# Patient Record
Sex: Female | Born: 1995 | Race: White | Hispanic: No | Marital: Single | Smoking: Former smoker
Health system: Southern US, Community
[De-identification: ages and names within clinical notes are randomized; demographics above are authoritative.]

## PROBLEM LIST (undated history)

## (undated) ENCOUNTER — Inpatient Hospital Stay (HOSPITAL_COMMUNITY): Payer: Self-pay

## (undated) ENCOUNTER — Inpatient Hospital Stay: Payer: Self-pay

## (undated) ENCOUNTER — Inpatient Hospital Stay (HOSPITAL_COMMUNITY): Payer: No Typology Code available for payment source | Admitting: Obstetrics and Gynecology

## (undated) DIAGNOSIS — E0590 Thyrotoxicosis, unspecified without thyrotoxic crisis or storm: Secondary | ICD-10-CM

## (undated) DIAGNOSIS — M797 Fibromyalgia: Secondary | ICD-10-CM

## (undated) DIAGNOSIS — J45909 Unspecified asthma, uncomplicated: Secondary | ICD-10-CM

## (undated) DIAGNOSIS — R5382 Chronic fatigue, unspecified: Secondary | ICD-10-CM

## (undated) HISTORY — PX: WISDOM TOOTH EXTRACTION: SHX21

## (undated) HISTORY — PX: UPPER GASTROINTESTINAL ENDOSCOPY: SHX188

## (undated) HISTORY — PX: NO PAST SURGERIES: SHX2092

---
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
## 2013-02-01 ENCOUNTER — Emergency Department (HOSPITAL_COMMUNITY)
Admission: EM | Admit: 2013-02-01 | Discharge: 2013-02-01 | Disposition: A | Discharge status: Home / Self Care | Payer: Medicaid Other | Attending: Emergency Medicine | Admitting: Emergency Medicine

## 2013-02-01 ENCOUNTER — Encounter (HOSPITAL_COMMUNITY): Payer: Self-pay | Admitting: *Deleted

## 2013-02-01 DIAGNOSIS — B0223 Postherpetic polyneuropathy: Secondary | ICD-10-CM | POA: Insufficient documentation

## 2013-02-01 DIAGNOSIS — M549 Dorsalgia, unspecified: Secondary | ICD-10-CM | POA: Insufficient documentation

## 2013-02-01 DIAGNOSIS — Z8739 Personal history of other diseases of the musculoskeletal system and connective tissue: Secondary | ICD-10-CM | POA: Insufficient documentation

## 2013-02-01 DIAGNOSIS — Z3202 Encounter for pregnancy test, result negative: Secondary | ICD-10-CM | POA: Insufficient documentation

## 2013-02-01 MED ORDER — ACYCLOVIR 200 MG PO CAPS
800.0000 mg | ORAL_CAPSULE | Freq: Once | ORAL | Status: AC
  Administered 2013-02-01: 800 mg via ORAL
  Filled 2013-02-01: qty 4

## 2013-02-01 MED ORDER — ACYCLOVIR 200 MG PO CAPS
400.0000 mg | ORAL_CAPSULE | Freq: Once | ORAL | Status: DC
  Filled 2013-02-01: qty 2

## 2013-02-01 MED ORDER — ACYCLOVIR 200 MG PO CAPS: 800.0000 mg | ORAL_CAPSULE | Freq: Every day | ORAL | Status: DC

## 2013-02-01 NOTE — ED Notes (Signed)
PT has raised non fluid filled areas on back lt upper side. Pt reports itching Sx's

## 2013-02-01 NOTE — ED Provider Notes (Signed)
History     CSN: [id number]  Arrival date & time 02/01/13  2026   First MD Initiated Contact with Patient 02/01/13 2106      Chief Complaint  Patient presents with  . Rash    (Consider location/radiation/quality/duration/timing/severity/associated sxs/prior treatment) HPI Comments: Patient has noticed a rash on her left mid back, below the this has been persistent and worsening.  She at first thought it was an allergic reaction, and has tried taking Benadryl, without resolution.  She reports, that it is gaining and slightly worse today.  Denies any fever, chills, or known ill contacts  Patient is a 17 y.o. female presenting with rash. The history is provided by the patient.  Rash Location:  Torso Quality: blistering, itchiness and painful   Quality: not draining and not weeping   Pain details:    Quality:  Stinging   Severity:  Mild   Onset quality:  Gradual   Duration:  3 days   Timing:  Constant   Progression:  Worsening Severity:  Moderate Onset quality:  Gradual Duration:  3 days Timing:  Constant Progression:  Worsening Chronicity:  New Relieved by:  Nothing Associated symptoms: no fever     Past Medical History  Diagnosis Date  . Fibromyalgia     History reviewed. No pertinent past surgical history.  No family history on file.  History  Substance Use Topics  . Smoking status: Never Smoker   . Smokeless tobacco: Not on file  . Alcohol Use: No    OB History   Grav Para Term Preterm Abortions TAB SAB Ect Mult Living                  Review of Systems  Constitutional: Negative for fever and chills.  HENT: Negative.   Eyes: Negative.   Respiratory: Negative.   Cardiovascular: Negative for chest pain.  Musculoskeletal: Positive for back pain.  Skin: Positive for rash.  Neurological: Negative for dizziness and weakness.  Psychiatric/Behavioral: Negative.   All other systems reviewed and are negative.    Allergies  Prednisone  Home  Medications   Current Outpatient Rx  Name  Route  Sig  Dispense  Refill  . acyclovir (ZOVIRAX) 200 MG capsule   Oral   Take 4 capsules (800 mg total) by mouth 5 (five) times daily.   28 capsule   0     BP 117/76  Pulse 84  Temp(Src) 98.3 F (36.8 C) (Oral)  SpO2 98%  LMP 01/18/2013  Physical Exam  Constitutional: She is oriented to person, place, and time. She appears well-developed and well-nourished.  HENT:  Head: Normocephalic.  Eyes: Pupils are equal, round, and reactive to light.  Neck: Normal range of motion.  Cardiovascular: Normal rate and regular rhythm.   Pulmonary/Chest: Effort normal and breath sounds normal. She exhibits no tenderness.  Musculoskeletal: Normal range of motion.  Neurological: She is alert and oriented to person, place, and time.  Skin: Skin is warm and dry. Rash noted.     Vesicular type rash route in a pattern, along a dermatome that extends from mid vertebral line under the scapula does not extend to the anterior chest wall..,  Although she does have some skin sensitivity, mid axillary line    ED Course  Procedures (including critical care time)  Labs Reviewed  POCT PREGNANCY, URINE   No results found.   1. Shingles (herpes zoster) polyneuropathy       MDM  Rash is consistent with shingles.  Will check pregnancy test as there is a slim chance she may be pregnant.  Despite the use of a NUVA ring then will start patient on acyclovir for 7 days with followup with her primary care physician         Arman Filter, NP 02/01/13 2228

## 2013-02-02 ENCOUNTER — Telehealth (HOSPITAL_COMMUNITY): Payer: Self-pay | Admitting: Emergency Medicine

## 2013-02-03 NOTE — ED Provider Notes (Signed)
Medical screening examination/treatment/procedure(s) were performed by non-physician practitioner and as supervising physician I was immediately available for consultation/collaboration.   Joya Gaskins, MD 02/03/13 1102

## 2013-03-27 ENCOUNTER — Emergency Department (HOSPITAL_COMMUNITY)
Admission: EM | Admit: 2013-03-27 | Discharge: 2013-03-28 | Disposition: A | Discharge status: Home / Self Care | Payer: Medicaid Other | Attending: Emergency Medicine | Admitting: Emergency Medicine

## 2013-03-27 ENCOUNTER — Encounter (HOSPITAL_COMMUNITY): Payer: Self-pay | Admitting: *Deleted

## 2013-03-27 DIAGNOSIS — F17200 Nicotine dependence, unspecified, uncomplicated: Secondary | ICD-10-CM | POA: Insufficient documentation

## 2013-03-27 DIAGNOSIS — R509 Fever, unspecified: Secondary | ICD-10-CM | POA: Insufficient documentation

## 2013-03-27 DIAGNOSIS — J029 Acute pharyngitis, unspecified: Secondary | ICD-10-CM | POA: Insufficient documentation

## 2013-03-27 DIAGNOSIS — Z8739 Personal history of other diseases of the musculoskeletal system and connective tissue: Secondary | ICD-10-CM | POA: Insufficient documentation

## 2013-03-27 LAB — RAPID STREP SCREEN (MED CTR MEBANE ONLY): Streptococcus, Group A Screen (Direct): NEGATIVE

## 2013-03-27 MED ORDER — ACETAMINOPHEN 325 MG PO TABS
650.0000 mg | ORAL_TABLET | Freq: Once | ORAL | Status: AC
  Administered 2013-03-27: 650 mg via ORAL

## 2013-03-27 NOTE — ED Notes (Signed)
Pt c/o sore throat, fever, chills, nausea since this morning.

## 2013-03-28 NOTE — ED Provider Notes (Addendum)
History     CSN: [id number]  Arrival date & time 03/27/13  2059   First MD Initiated Contact with Patient 03/27/13 2356      Chief Complaint  Patient presents with  . Sore Throat  . Fever    (Consider location/radiation/quality/duration/timing/severity/associated sxs/prior treatment) Patient is a 17 y.o. female presenting with pharyngitis and fever. The history is provided by the patient.  Sore Throat This is a new problem. The current episode started 6 to 12 hours ago. The problem occurs constantly. The problem has not changed since onset.Pertinent negatives include no chest pain, no abdominal pain and no shortness of breath. Associated symptoms comments: Fever of 100.4 and sore throat staring today. The symptoms are aggravated by swallowing. The symptoms are relieved by acetaminophen. She has tried acetaminophen for the symptoms. The treatment provided significant relief.  Fever Associated symptoms: no chest pain, no congestion, no cough, no ear pain and no rhinorrhea     Past Medical History  Diagnosis Date  . Fibromyalgia     History reviewed. No pertinent past surgical history.  History reviewed. No pertinent family history.  History  Substance Use Topics  . Smoking status: Current Every Day Smoker -- 0.50 packs/day  . Smokeless tobacco: Not on file  . Alcohol Use: Yes     Comment: occ    OB History   Grav Para Term Preterm Abortions TAB SAB Ect Mult Living                  Review of Systems  Constitutional: Positive for fever.  HENT: Negative for ear pain, congestion, rhinorrhea, neck pain and ear discharge.   Respiratory: Negative for cough and shortness of breath.   Cardiovascular: Negative for chest pain.  Gastrointestinal: Negative for abdominal pain.  All other systems reviewed and are negative.    Allergies  Other; Prednisone; and Vicodin  Home Medications  No current outpatient prescriptions on file.  BP 126/65  Pulse 93  Temp(Src) 98.9 F  (37.2 C) (Oral)  Resp 23  SpO2 99%  LMP 03/09/2013  Physical Exam  Nursing note and vitals reviewed. Constitutional: She is oriented to person, place, and time. She appears well-developed and well-nourished. No distress.  HENT:  Head: Normocephalic and atraumatic.  Right Ear: Tympanic membrane and ear canal normal.  Left Ear: Tympanic membrane and ear canal normal.  Mouth/Throat: Posterior oropharyngeal erythema present. No oropharyngeal exudate, posterior oropharyngeal edema or tonsillar abscesses.  Eyes: Conjunctivae and EOM are normal. Pupils are equal, round, and reactive to light.  Neck: Normal range of motion. Neck supple.  Cardiovascular: Normal rate, regular rhythm and intact distal pulses.   No murmur heard. Pulmonary/Chest: Effort normal and breath sounds normal. No respiratory distress. She has no wheezes. She has no rales.  Abdominal: Soft. She exhibits no distension. There is no tenderness. There is no rebound and no guarding.  Musculoskeletal: Normal range of motion. She exhibits no edema and no tenderness.  Lymphadenopathy:    She has no cervical adenopathy.  Neurological: She is alert and oriented to person, place, and time.  Skin: Skin is warm and dry. No rash noted. No erythema.  Psychiatric: She has a normal mood and affect. Her behavior is normal.    ED Course  Procedures (including critical care time)  Labs Reviewed  RAPID STREP SCREEN   No results found.   1. Acute viral pharyngitis       MDM   Pt with symptoms consistent with viral pharnygitis.  Well appearing here.  No signs of breathing difficulty  No signs of otitis or abnormal abdominal findings.   Rapid strep wnl and pt to return with any further problems.         Gwyneth Sprout, MD 03/28/13 4098  Gwyneth Sprout, MD 03/28/13 1191

## 2013-05-22 ENCOUNTER — Emergency Department (HOSPITAL_COMMUNITY)
Admission: EM | Admit: 2013-05-22 | Discharge: 2013-05-22 | Disposition: A | Discharge status: Home / Self Care | Payer: Medicaid Other | Attending: Emergency Medicine | Admitting: Emergency Medicine

## 2013-05-22 ENCOUNTER — Encounter (HOSPITAL_COMMUNITY): Payer: Self-pay | Admitting: *Deleted

## 2013-05-22 DIAGNOSIS — Z8739 Personal history of other diseases of the musculoskeletal system and connective tissue: Secondary | ICD-10-CM | POA: Insufficient documentation

## 2013-05-22 DIAGNOSIS — O99330 Smoking (tobacco) complicating pregnancy, unspecified trimester: Secondary | ICD-10-CM | POA: Insufficient documentation

## 2013-05-22 DIAGNOSIS — O9989 Other specified diseases and conditions complicating pregnancy, childbirth and the puerperium: Secondary | ICD-10-CM | POA: Insufficient documentation

## 2013-05-22 DIAGNOSIS — O219 Vomiting of pregnancy, unspecified: Secondary | ICD-10-CM

## 2013-05-22 DIAGNOSIS — O210 Mild hyperemesis gravidarum: Secondary | ICD-10-CM | POA: Insufficient documentation

## 2013-05-22 DIAGNOSIS — R197 Diarrhea, unspecified: Secondary | ICD-10-CM

## 2013-05-22 LAB — COMPREHENSIVE METABOLIC PANEL
ALT: 33 U/L (ref 0–35)
Alkaline Phosphatase: 80 U/L (ref 39–117)
CO2: 20 mEq/L (ref 19–32)
GFR calc Af Amer: >90 mL/min (ref >90)
GFR calc non Af Amer: >90 mL/min (ref >90)
Glucose, Bld: 101 mg/dL — ABNORMAL HIGH (ref 70–99)
Potassium: 3.9 mEq/L (ref 3.5–5.1)
Sodium: 135 mEq/L (ref 135–145)
Total Bilirubin: 0.7 mg/dL (ref 0.3–1.2)

## 2013-05-22 LAB — URINALYSIS, ROUTINE W REFLEX MICROSCOPIC
Glucose, UA: NEGATIVE mg/dL
Hgb urine dipstick: NEGATIVE
Protein, ur: NEGATIVE mg/dL
Specific Gravity, Urine: 1.023 (ref 1.005–1.030)

## 2013-05-22 LAB — WET PREP, GENITAL: Yeast Wet Prep HPF POC: NONE SEEN

## 2013-05-22 MED ORDER — MECLIZINE HCL 25 MG PO CHEW: 25.0000 mg | CHEWABLE_TABLET | Freq: Four times a day (QID) | ORAL | Status: DC | PRN

## 2013-05-22 MED ORDER — ONDANSETRON HCL 4 MG/2ML IJ SOLN
4.0000 mg | Freq: Once | INTRAMUSCULAR | Status: AC
  Administered 2013-05-22: 4 mg via INTRAVENOUS

## 2013-05-22 MED ORDER — SODIUM CHLORIDE 0.9 % IV BOLUS (SEPSIS)
1000.0000 mL | Freq: Once | INTRAVENOUS | Status: AC
  Administered 2013-05-22: 1000 mL via INTRAVENOUS

## 2013-05-22 MED ORDER — ONDANSETRON HCL 4 MG/2ML IJ SOLN
4.0000 mg | Freq: Once | INTRAMUSCULAR | Status: AC
  Administered 2013-05-22: 4 mg via INTRAVENOUS
  Filled 2013-05-22: qty 2

## 2013-05-22 MED ORDER — ONDANSETRON HCL 4 MG/2ML IJ SOLN
INTRAMUSCULAR | Status: AC
  Filled 2013-05-22: qty 2

## 2013-05-22 NOTE — ED Provider Notes (Signed)
History     CSN: [id number]  Arrival date & time 05/22/13  7829   First MD Initiated Contact with Patient 05/22/13 [phone]      Chief Complaint  Patient presents with  . Emesis    (Consider location/radiation/quality/duration/timing/severity/associated sxs/prior treatment) HPI  [name] is 17 y.o. f who is approx. [redacted] weeks pregnant with no prenatal care to this point who presents with chief complaint of vomiting, diarrhea.  Patient states that she has had some daily nausea since getting pregnant with approximately 1-2 episodes of vomiting daily.  This morning around 2:30 AM she woke up with intractable vomiting which then progressed into profuse watery diarrhea.  Patient states she's had greater than 24 episodes of vomiting and diarrhea.  She denies any bilious or bloody vomitus, she denies any bloody diarrhea. She denies contacts with similar sxs, ingestion of suspect foods or water, history of similar sxs, recent foreign travel . She has some mild bilateral upper quadrant pain and some abdominal wall cramping.  She denies any blood or fluid from her vagina, any vaginal or urinary symptoms.    Past Medical History  Diagnosis Date  . Fibromyalgia     History reviewed. No pertinent past surgical history.  No family history on file.  History  Substance Use Topics  . Smoking status: Current Every Day Smoker -- 0.50 packs/day  . Smokeless tobacco: Not on file  . Alcohol Use: Yes     Comment: occ    OB History   Grav Para Term Preterm Abortions TAB SAB Ect Mult Living   1               Review of Systems Ten systems reviewed and are negative for acute change, except as noted in the HPI.   Allergies  Other; Prednisone; and Vicodin  Home Medications  No current outpatient prescriptions on file.  BP 132/80  Pulse 78  Temp(Src) 98 F (36.7 C) (Oral)  Resp 16  SpO2 100%  LMP 03/09/2013  Physical Exam Physical Exam  Nursing note and vitals  reviewed. Constitutional: She is oriented to person, place, and time. She appears well-developed and well-nourished. No distress.  Obese female.  She does not appear clinically dehydrated but appears uncomfortable. HENT:  Head: Normocephalic and atraumatic.  Eyes: Conjunctivae normal and EOM are normal. Pupils are equal, round, and reactive to light. No scleral icterus.  Neck: Normal range of motion.  Cardiovascular: Normal rate, regular rhythm and normal heart sounds.  Exam reveals no gallop and no friction rub.   No murmur heard. Pulmonary/Chest: Effort normal and breath sounds normal. No respiratory distress.  Abdominal: Soft. Bowel sounds are normal. She exhibits no distension and no mass. There is no tenderness. There is no guarding. unable to assess fundal height due to body habitus. Neurological: She is alert and oriented to person, place, and time.  Skin: Skin is warm and dry. She is not diaphoretic.    ED Course  Pelvic exam Date/Time: 05/22/2013 10:31 AM Performed by: Arthor Captain Authorized by: Arthor Captain Consent: Verbal consent obtained. Risks and benefits: risks, benefits and alternatives were discussed Consent given by: patient Patient understanding: patient states understanding of the procedure being performed Patient identity confirmed: verbally with patient Time out: Immediately prior to procedure a "time out" was called to verify the correct patient, procedure, equipment, support staff and site/side marked as required. Preparation: Patient was prepped and draped in the usual sterile fashion. Comments: Pelvic exam: VULVA: normal appearing vulva  with no masses, tenderness or lesions, VAGINA: normal appearing vagina with normal color and discharge, no lesions, CERVIX: normal appearing cervix without discharge or lesions, nulliparous no cmt UTERUS: unable to palpate due to patient body habitus, ADNEXA: normal adnexa in size, nontender and no masses.    (including  critical care time)  Labs Reviewed  WET PREP, GENITAL - Abnormal; Notable for the following:    WBC, Wet Prep HPF POC FEW (*)    All other components within normal limits  URINALYSIS, ROUTINE W REFLEX MICROSCOPIC - Abnormal; Notable for the following:    APPearance TURBID (*)    Ketones, ur >80 (*)    All other components within normal limits  COMPREHENSIVE METABOLIC PANEL - Abnormal; Notable for the following:    Glucose, Bld 101 (*)    Creatinine, Ser 0.48 (*)    All other components within normal limits  URINE MICROSCOPIC-ADD ON - Abnormal; Notable for the following:    Squamous Epithelial / LPF FEW (*)    All other components within normal limits  GC/CHLAMYDIA PROBE AMP  LIPASE, BLOOD   No results found.   No diagnosis found.    MDM  9:30 AM  BP 132/80  Pulse 78  Temp(Src) 98 F (36.7 C) (Oral)  Resp 16  SpO2 100%  LMP 03/09/2013 Patient with N/V . Will obtain basic labs and pelvic exam  10:35 AM Patient without vomiting mild nausea. Receiving fluids    1:56 PM Patient holding down fluid. Still nauseated. Will d/c with meclizine. F/u with ob or go to MAU for worsening sxs.  Arthor Captain, PA-C 05/22/13 1357

## 2013-05-22 NOTE — ED Notes (Signed)
Pt reports [redacted] weeks pregnant and having nausea, vomiting, diarrhea. No bleeding/discharge. Has not seen OB yet. Vomited 25+ times in last 24 hours.

## 2013-05-22 NOTE — ED Provider Notes (Signed)
Medical screening examination/treatment/procedure(s) were performed by non-physician practitioner and as supervising physician I was immediately available for consultation/collaboration.  Lyanne Co, MD 05/22/13 [phone]

## 2013-05-25 LAB — OB RESULTS CONSOLE HEPATITIS B SURFACE ANTIGEN: Hepatitis B Surface Ag: NEGATIVE

## 2013-05-25 LAB — OB RESULTS CONSOLE RUBELLA ANTIBODY, IGM: Rubella: IMMUNE

## 2013-05-25 LAB — OB RESULTS CONSOLE RPR: RPR: NONREACTIVE

## 2013-05-25 LAB — OB RESULTS CONSOLE HIV ANTIBODY (ROUTINE TESTING): HIV: NONREACTIVE

## 2013-07-17 ENCOUNTER — Inpatient Hospital Stay (HOSPITAL_COMMUNITY)
Admission: AD | Admit: 2013-07-17 | Discharge: 2013-07-17 | Disposition: A | Discharge status: Home / Self Care | Payer: Medicaid Other | Source: Ambulatory Visit | Attending: Obstetrics and Gynecology | Admitting: Obstetrics and Gynecology

## 2013-07-17 ENCOUNTER — Encounter (HOSPITAL_COMMUNITY): Payer: Self-pay | Admitting: Obstetrics and Gynecology

## 2013-07-17 DIAGNOSIS — N949 Unspecified condition associated with female genital organs and menstrual cycle: Secondary | ICD-10-CM | POA: Insufficient documentation

## 2013-07-17 DIAGNOSIS — O99891 Other specified diseases and conditions complicating pregnancy: Secondary | ICD-10-CM | POA: Insufficient documentation

## 2013-07-17 DIAGNOSIS — R1031 Right lower quadrant pain: Secondary | ICD-10-CM | POA: Insufficient documentation

## 2013-07-17 HISTORY — DX: Chronic fatigue, unspecified: R53.82

## 2013-07-17 LAB — WET PREP, GENITAL: Clue Cells Wet Prep HPF POC: NONE SEEN

## 2013-07-17 LAB — URINALYSIS, ROUTINE W REFLEX MICROSCOPIC
Glucose, UA: NEGATIVE mg/dL
Hgb urine dipstick: NEGATIVE
Specific Gravity, Urine: 1.02 (ref 1.005–1.030)

## 2013-07-17 MED ORDER — IBUPROFEN 600 MG PO TABS
600.0000 mg | ORAL_TABLET | Freq: Once | ORAL | Status: AC
  Administered 2013-07-17: 600 mg via ORAL
  Filled 2013-07-17: qty 1

## 2013-07-17 NOTE — MAU Note (Signed)
[name] is [redacted]w[redacted]d with complaints of urinary tract infection symptoms. She has pain when she urinates, and lower abdominal pain. She is a G1 and receives her care at [facility]

## 2013-07-17 NOTE — MAU Note (Signed)
Patient presents to MAU with c/o intermittent stabbing RLQ pain, and pain in same area with urination. Reports difficulty starting stream of urine. Reports hx of UTI and bladder infections.

## 2013-07-17 NOTE — MAU Provider Note (Signed)
  History     CSN: [id number]  Arrival date and time: 07/17/13 1816   None     Chief Complaint  Patient presents with  . Vaginal Discharge   HPI Comments: Pt is a G1P0 at [redacted]w[redacted]d arrives unannounced w c/o RLQ/groin pain, onset today, come and goes, but is also constant, stabbing pain, does not feel like menstrual cramps, c/o some urinary urgency, bladder pressure, denies any recent IC, no hx STD's. Denies any VB, reports brown d/c, without odor.      Past Medical History  Diagnosis Date  . Fibromyalgia   . Chronic fatigue     History reviewed. No pertinent past surgical history.  History reviewed. No pertinent family history.  History  Substance Use Topics  . Smoking status: Current Every Day Smoker -- 0.50 packs/day  . Smokeless tobacco: Not on file  . Alcohol Use: Yes     Comment: occ    Allergies:  Allergies  Allergen Reactions  . Other Nausea And Vomiting and Other (See Comments)    Blue Cheese causes sweating nausea and vomitting  . Prednisone Other (See Comments)    Severe Depression  . Vicodin (Hydrocodone-Acetaminophen) Nausea And Vomiting    Prescriptions prior to admission  Medication Sig Dispense Refill  . acetaminophen (TYLENOL) 500 MG tablet Take 500-1,000 mg by mouth every 6 (six) hours as needed for pain.      . calcium carbonate (TUMS - DOSED IN MG ELEMENTAL CALCIUM) 500 MG chewable tablet Chew 3 tablets by mouth daily as needed for heartburn.      . Meclizine HCl 25 MG CHEW Chew 1 tablet by mouth daily as needed (for morning sickness).        Review of Systems  Gastrointestinal: Negative for abdominal pain.  Genitourinary: Positive for urgency and frequency. Negative for dysuria and hematuria.       Manson Passey d/c   Musculoskeletal:       RLQ/groin pain, pain scale 5/10 at worst   All other systems reviewed and are negative.   Physical Exam   Blood pressure 123/66, pulse 81, temperature 98.2 F (36.8 C), temperature source Oral, resp. rate 18,  last menstrual period 03/09/2013.  Physical Exam  Nursing note and vitals reviewed. Constitutional: She is oriented to person, place, and time. She appears well-developed and well-nourished.  HENT:  Head: Normocephalic.  Eyes: Pupils are equal, round, and reactive to light.  Neck: Normal range of motion.  Cardiovascular: Normal rate.   Respiratory: Effort normal.  GI: Soft.  Genitourinary: Vagina normal.  Spec exam, scant amt dark brown d/c in vault, cervix slightly friable, but cl/th/high/firm  Musculoskeletal: Normal range of motion.  Neurological: She is alert and oriented to person, place, and time. She has normal reflexes.  Skin: Skin is warm and dry.  Psychiatric: She has a normal mood and affect. Her behavior is normal.    MAU Course  Procedures   Assessment and Plan  IUP at 15wks RLP Wet prep neg Pain improved after motrin 600mg  GC/CT pending  Dc home, miscarriage precautions,  rv'd RLP comfort measures  Can take motrin PO PRN F/u office routine as scheduled   Leray Garverick M 07/17/2013, 8:08 PM

## 2013-07-19 LAB — URINE CULTURE: Colony Count: 4000

## 2013-09-08 ENCOUNTER — Observation Stay: Payer: Self-pay | Admitting: Obstetrics and Gynecology

## 2013-09-08 LAB — URINALYSIS, COMPLETE
Nitrite: NEGATIVE
Ph: 6 (ref 4.5–8.0)
Protein: NEGATIVE
RBC,UR: 282 /HPF (ref 0–5)
Specific Gravity: 1.015 (ref 1.003–1.030)
WBC UR: 3 /HPF (ref 0–5)

## 2013-11-19 ENCOUNTER — Encounter (HOSPITAL_COMMUNITY): Payer: Self-pay | Admitting: *Deleted

## 2013-11-19 ENCOUNTER — Inpatient Hospital Stay (HOSPITAL_COMMUNITY)
Admission: AD | Admit: 2013-11-19 | Discharge: 2013-11-19 | Disposition: A | Discharge status: Home / Self Care | Payer: Medicaid Other | Source: Ambulatory Visit | Attending: Obstetrics and Gynecology | Admitting: Obstetrics and Gynecology

## 2013-11-19 DIAGNOSIS — O4700 False labor before 37 completed weeks of gestation, unspecified trimester: Secondary | ICD-10-CM | POA: Insufficient documentation

## 2013-11-19 LAB — URINALYSIS, ROUTINE W REFLEX MICROSCOPIC
Bilirubin Urine: NEGATIVE
Glucose, UA: NEGATIVE mg/dL
Ketones, ur: NEGATIVE mg/dL
Protein, ur: NEGATIVE mg/dL

## 2013-11-19 LAB — WET PREP, GENITAL
Clue Cells Wet Prep HPF POC: NONE SEEN
Yeast Wet Prep HPF POC: NONE SEEN

## 2013-11-19 LAB — FETAL FIBRONECTIN: Fetal Fibronectin: NEGATIVE

## 2013-11-19 MED ORDER — NIFEDIPINE 10 MG PO CAPS
10.0000 mg | ORAL_CAPSULE | ORAL | Status: DC | PRN
  Administered 2013-11-19: 10 mg via ORAL
  Filled 2013-11-19: qty 1

## 2013-11-19 MED ORDER — NITROFURANTOIN MONOHYD MACRO 100 MG PO CAPS: 100.0000 mg | ORAL_CAPSULE | Freq: Two times a day (BID) | ORAL | Status: DC

## 2013-11-19 MED ORDER — NIFEDIPINE 10 MG PO CAPS: 10.0000 mg | ORAL_CAPSULE | Freq: Four times a day (QID) | ORAL | Status: DC | PRN

## 2013-11-19 NOTE — MAU Note (Signed)
Pt G1 at 33wks with contractions and pelvic pressure since 1830, denies bleeding.  Reports a reddish/orange discharge earlier this week, no discharge at this time.

## 2013-11-20 LAB — CULTURE, OB URINE

## 2013-12-06 ENCOUNTER — Inpatient Hospital Stay (HOSPITAL_COMMUNITY)
Admission: AD | Admit: 2013-12-06 | Discharge: 2013-12-06 | Disposition: A | Discharge status: Home / Self Care | Payer: Medicaid Other | Source: Ambulatory Visit | Attending: Obstetrics and Gynecology | Admitting: Obstetrics and Gynecology

## 2013-12-06 ENCOUNTER — Encounter (HOSPITAL_COMMUNITY): Payer: Self-pay | Admitting: *Deleted

## 2013-12-06 DIAGNOSIS — M797 Fibromyalgia: Secondary | ICD-10-CM

## 2013-12-06 DIAGNOSIS — F17200 Nicotine dependence, unspecified, uncomplicated: Secondary | ICD-10-CM | POA: Diagnosis present

## 2013-12-06 DIAGNOSIS — H9190 Unspecified hearing loss, unspecified ear: Secondary | ICD-10-CM | POA: Insufficient documentation

## 2013-12-06 DIAGNOSIS — G43909 Migraine, unspecified, not intractable, without status migrainosus: Secondary | ICD-10-CM | POA: Diagnosis not present

## 2013-12-06 DIAGNOSIS — O26859 Spotting complicating pregnancy, unspecified trimester: Secondary | ICD-10-CM | POA: Insufficient documentation

## 2013-12-06 DIAGNOSIS — H9191 Unspecified hearing loss, right ear: Secondary | ICD-10-CM | POA: Diagnosis present

## 2013-12-06 DIAGNOSIS — O99330 Smoking (tobacco) complicating pregnancy, unspecified trimester: Secondary | ICD-10-CM | POA: Insufficient documentation

## 2013-12-06 DIAGNOSIS — Z87448 Personal history of other diseases of urinary system: Secondary | ICD-10-CM | POA: Diagnosis not present

## 2013-12-06 DIAGNOSIS — O4700 False labor before 37 completed weeks of gestation, unspecified trimester: Secondary | ICD-10-CM | POA: Insufficient documentation

## 2013-12-06 DIAGNOSIS — R300 Dysuria: Secondary | ICD-10-CM | POA: Insufficient documentation

## 2013-12-06 DIAGNOSIS — R718 Other abnormality of red blood cells: Secondary | ICD-10-CM | POA: Diagnosis not present

## 2013-12-06 DIAGNOSIS — J45909 Unspecified asthma, uncomplicated: Secondary | ICD-10-CM | POA: Diagnosis not present

## 2013-12-06 DIAGNOSIS — Z889 Allergy status to unspecified drugs, medicaments and biological substances status: Secondary | ICD-10-CM

## 2013-12-06 LAB — OB RESULTS CONSOLE GBS: GBS: NEGATIVE

## 2013-12-06 LAB — WET PREP, GENITAL
Clue Cells Wet Prep HPF POC: NONE SEEN
Trich, Wet Prep: NONE SEEN

## 2013-12-06 LAB — URINALYSIS, ROUTINE W REFLEX MICROSCOPIC
Bilirubin Urine: NEGATIVE
Nitrite: NEGATIVE
Protein, ur: NEGATIVE mg/dL
Specific Gravity, Urine: 1.025 (ref 1.005–1.030)
Urobilinogen, UA: 0.2 mg/dL (ref 0.0–1.0)

## 2013-12-06 LAB — URINE MICROSCOPIC-ADD ON

## 2013-12-06 NOTE — MAU Note (Signed)
Red spotting x 1 hr. Was contracting earlier tonight, no pain now. Positive fetal movement.

## 2013-12-06 NOTE — MAU Provider Note (Signed)
History   17 yo G1P0 at 63 3/7 weeks presented after calling to report contractions earlier last night, now with episode of bright red spotting.  Denies leaking of fluid, reports +FM.  Now admits to IC late last night after episode of cramping.  Also was given Procardia at MAU visit 11/19/13, but has not used.  Also Rx'd Macrobid from 11/27 visit at [facility] one day, then was informed of negative urine culture and stopped it.  She reported feeling some dysuria yesterday and restarted the med.  Cultures and FFN negative on 11/19/13.  Patient Active Problem List   Diagnosis Date Noted  . Smoker 12/06/2013  . Migraine, unspecified, without mention of intractable migraine without mention of status migrainosus 12/06/2013  . Hx of pyelonephritis--2013 12/06/2013  . Hearing loss in right ear 12/06/2013  . Asthma, chronic--exercise induced 12/06/2013  . RBC abnormality--small RBCs, dx at [facility], told to take Fe. 12/06/2013  . Fibromyalgia/chronic fatigue 12/06/2013  . Allergy history, drug--prednisone 12/06/2013  . ABDOMINAL PAIN RIGHT LOWER QUADRANT 01/20/2008  . CONSTIPATION, HX OF 01/20/2008     Chief Complaint  Patient presents with  . Vaginal Bleeding   HPI:  See above  OB History   Grav Para Term Preterm Abortions TAB SAB Ect Mult Living   1               Past Medical History  Diagnosis Date  . Fibromyalgia   . Chronic fatigue     Past Surgical History  Procedure Laterality Date  . No past surgeries      History reviewed. No pertinent family history.  History  Substance Use Topics  . Smoking status: Current Every Day Smoker -- 0.25 packs/day    Types: Cigarettes  . Smokeless tobacco: Not on file  . Alcohol Use: No    Allergies:  Allergies  Allergen Reactions  . Other Nausea And Vomiting and Other (See Comments)    Blue Cheese causes sweating nausea and vomitting  . Prednisone Other (See Comments)    Severe Depression  . Vicodin [Hydrocodone-Acetaminophen]  Nausea And Vomiting    Prescriptions prior to admission  Medication Sig Dispense Refill  . NIFEdipine (PROCARDIA) 10 MG capsule Take 1 capsule (10 mg total) by mouth every 6 (six) hours as needed (contractions).  40 capsule  1  . nitrofurantoin, macrocrystal-monohydrate, (MACROBID) 100 MG capsule Take 1 capsule (100 mg total) by mouth 2 (two) times daily.  14 capsule  0  . Prenatal Vit-Fe Fumarate-FA (PRENATAL MULTIVITAMIN) TABS tablet Take 1 tablet by mouth daily at 12 noon.      . promethazine (PHENERGAN) 25 MG tablet Take 25 mg by mouth every 6 (six) hours as needed for nausea or vomiting.        ROS:  Mild cramping, spotting, + FM, mild dysuria Physical Exam   Blood pressure 143/85, pulse 86, temperature 98.1 F (36.7 C), temperature source Oral, resp. rate 18, height 5\' 7"  (1.702 m), weight 216 lb (97.977 kg), last menstrual period 03/09/2013.  Physical Exam Chest clear Heart RRR without murmur Abd gravid, NT Pelvic--closed, long, vtx -2, cervix slightly friable Ext WNL  FHR Category 1 UCs irregular, mild, some irritability ED Course  IUP at 35 3/7 weeks Uterine irritability, increased by IC Spotting after IC Mild dysuria No evidence PTL  Plan: GBS, GC, chlamydia, wet prep UA Push po fluids. Will await wet prep and UA results.   Nigel Bridgeman CNM, [place] 12/06/2013 3:39 AM  Addendum: Resting comfortably--aware of  most (but not all) contractions, mild. Results for orders placed during the hospital encounter of 12/06/13 (from the past 24 hour(s))  URINALYSIS, ROUTINE W REFLEX MICROSCOPIC     Status: Abnormal   Collection Time    12/06/13  3:10 AM      Result Value Range   Color, Urine YELLOW  YELLOW   APPearance CLEAR  CLEAR   Specific Gravity, Urine 1.025  1.005 - 1.030   pH 6.0  5.0 - 8.0   Glucose, UA NEGATIVE  NEGATIVE mg/dL   Hgb urine dipstick NEGATIVE  NEGATIVE   Bilirubin Urine NEGATIVE  NEGATIVE   Ketones, ur NEGATIVE  NEGATIVE mg/dL   Protein, ur  NEGATIVE  NEGATIVE mg/dL   Urobilinogen, UA 0.2  0.0 - 1.0 mg/dL   Nitrite NEGATIVE  NEGATIVE   Leukocytes, UA SMALL (*) NEGATIVE  URINE MICROSCOPIC-ADD ON     Status: Abnormal   Collection Time    12/06/13  3:10 AM      Result Value Range   Squamous Epithelial / LPF FEW (*) RARE   WBC, UA 3-6  <3 WBC/hpf   Bacteria, UA FEW (*) RARE  WET PREP, GENITAL     Status: Abnormal   Collection Time    12/06/13  3:20 AM      Result Value Range   Yeast Wet Prep HPF POC NONE SEEN  NONE SEEN   Trich, Wet Prep NONE SEEN  NONE SEEN   Clue Cells Wet Prep HPF POC NONE SEEN  NONE SEEN   WBC, Wet Prep HPF POC FEW (*) NONE SEEN   Urine sent to culture. GC, Chlamydia, GBS pending.  FHR Category 1 UCs irregular, mild, q 2-7 min.  D/C'd home. Encouraged to use Procardia q 6 hours prn until 36 weeks. Complete Macrobid course. Push fluids and increase rest. Defer IC until 36 weeks. Keep scheduled appt at [facility] 12/18/4, or call prn.  Nigel Bridgeman, CNM 12/06/13

## 2013-12-07 LAB — GC/CHLAMYDIA PROBE AMP: GC Probe RNA: NEGATIVE

## 2013-12-07 LAB — URINE CULTURE

## 2013-12-08 LAB — CULTURE, BETA STREP (GROUP B ONLY)

## 2013-12-13 ENCOUNTER — Inpatient Hospital Stay (HOSPITAL_COMMUNITY)
Admission: AD | Admit: 2013-12-13 | Discharge: 2013-12-13 | Disposition: A | Discharge status: Home / Self Care | Payer: Medicaid Other | Source: Ambulatory Visit | Attending: Obstetrics and Gynecology | Admitting: Obstetrics and Gynecology

## 2013-12-13 ENCOUNTER — Encounter (HOSPITAL_COMMUNITY): Payer: Self-pay

## 2013-12-13 DIAGNOSIS — R3915 Urgency of urination: Secondary | ICD-10-CM | POA: Insufficient documentation

## 2013-12-13 DIAGNOSIS — O99891 Other specified diseases and conditions complicating pregnancy: Secondary | ICD-10-CM | POA: Insufficient documentation

## 2013-12-13 DIAGNOSIS — O99330 Smoking (tobacco) complicating pregnancy, unspecified trimester: Secondary | ICD-10-CM | POA: Insufficient documentation

## 2013-12-13 DIAGNOSIS — O4700 False labor before 37 completed weeks of gestation, unspecified trimester: Secondary | ICD-10-CM | POA: Insufficient documentation

## 2013-12-13 LAB — AMNISURE RUPTURE OF MEMBRANE (ROM) NOT AT ARMC: Amnisure ROM: NEGATIVE

## 2013-12-13 LAB — URINALYSIS, ROUTINE W REFLEX MICROSCOPIC
Glucose, UA: NEGATIVE mg/dL
Ketones, ur: NEGATIVE mg/dL
Leukocytes, UA: NEGATIVE
Nitrite: NEGATIVE
Protein, ur: NEGATIVE mg/dL
Urobilinogen, UA: 0.2 mg/dL (ref 0.0–1.0)

## 2013-12-13 NOTE — MAU Provider Note (Signed)
  History     CSN: [id number]  Arrival date and time: 12/13/13 1704   None     Chief Complaint  Patient presents with  . R/O Rupture    HPI Comments: Pt is a 17yo G1P0 at [redacted]w[redacted]d that arrives after calling CNM on call to report leakage of fluid, happened yesterday then stopped and happened again today, states it was enough to soak underwear, clear watery fluid, denies any VB or ctx, +FM. Reports urinary urgency that just started when she arrived, denies dysuria, states she just finished ABX for UTI this week.      Past Medical History  Diagnosis Date  . Fibromyalgia   . Chronic fatigue     Past Surgical History  Procedure Laterality Date  . No past surgeries      History reviewed. No pertinent family history.  History  Substance Use Topics  . Smoking status: Current Every Day Smoker -- 0.25 packs/day    Types: Cigarettes  . Smokeless tobacco: Not on file  . Alcohol Use: No    Allergies:  Allergies  Allergen Reactions  . Other Nausea And Vomiting and Other (See Comments)    Blue Cheese causes sweating nausea and vomitting  . Prednisone Other (See Comments)    Severe Depression  . Vicodin [Hydrocodone-Acetaminophen] Nausea And Vomiting    Prescriptions prior to admission  Medication Sig Dispense Refill  . nitrofurantoin, macrocrystal-monohydrate, (MACROBID) 100 MG capsule Take 1 capsule (100 mg total) by mouth 2 (two) times daily.  14 capsule  0  . Prenatal Vit-Fe Fumarate-FA (PRENATAL MULTIVITAMIN) TABS tablet Take 1 tablet by mouth daily at 12 noon.        Review of Systems  Gastrointestinal: Negative for nausea, vomiting and abdominal pain.  Genitourinary: Positive for urgency. Negative for dysuria, frequency, hematuria and flank pain.  All other systems reviewed and are negative.   Physical Exam   Blood pressure 124/83, pulse 92, temperature 98.6 F (37 C), temperature source Oral, resp. rate 18, height 5\' 7"  (1.702 m), weight 217 lb (98.431 kg), last  menstrual period 03/09/2013.  Physical Exam  Nursing note and vitals reviewed. Constitutional: She is oriented to person, place, and time. She appears well-developed and well-nourished.  HENT:  Head: Normocephalic.  Eyes: Pupils are equal, round, and reactive to light.  Neck: Normal range of motion.  Cardiovascular: Normal rate, regular rhythm and normal heart sounds.   Respiratory: Effort normal and breath sounds normal.  GI: Soft. Bowel sounds are normal.  Genitourinary: Vagina normal.  VE =FT/70/-2 ballottable   Musculoskeletal: Normal range of motion.  Neurological: She is alert and oriented to person, place, and time. She has normal reflexes.  Skin: Skin is warm and dry.  Psychiatric: She has a normal mood and affect. Her behavior is normal.    MAU Course  Procedures    Assessment and Plan  IUP at [redacted]w[redacted]d R/o ROM FHR cat 1 toco rare amnisure neg  D/c home Routine f/u  rv'd labor and FKC   Winifred Balogh M 12/13/2013, 6:47 PM

## 2013-12-13 NOTE — MAU Note (Signed)
Pt states noticed yesterday 1700 was having ?discharge, clear/mucousy/non-odorous, noted more today when wiping. Is having contractions. No bleeding. "Leaking" small amount now, not enough to fill up a pad.

## 2013-12-15 ENCOUNTER — Encounter (HOSPITAL_COMMUNITY): Payer: Self-pay

## 2013-12-15 ENCOUNTER — Inpatient Hospital Stay (HOSPITAL_COMMUNITY)
Admission: AD | Admit: 2013-12-15 | Discharge: 2013-12-15 | Disposition: A | Discharge status: Home / Self Care | Payer: Medicaid Other | Source: Ambulatory Visit | Attending: Obstetrics and Gynecology | Admitting: Obstetrics and Gynecology

## 2013-12-15 ENCOUNTER — Inpatient Hospital Stay (HOSPITAL_COMMUNITY): Payer: Medicaid Other

## 2013-12-15 DIAGNOSIS — O99891 Other specified diseases and conditions complicating pregnancy: Secondary | ICD-10-CM | POA: Insufficient documentation

## 2013-12-15 DIAGNOSIS — O99330 Smoking (tobacco) complicating pregnancy, unspecified trimester: Secondary | ICD-10-CM | POA: Insufficient documentation

## 2013-12-15 DIAGNOSIS — R51 Headache: Secondary | ICD-10-CM | POA: Insufficient documentation

## 2013-12-15 DIAGNOSIS — R030 Elevated blood-pressure reading, without diagnosis of hypertension: Secondary | ICD-10-CM | POA: Insufficient documentation

## 2013-12-15 LAB — COMPREHENSIVE METABOLIC PANEL
ALT: 10 U/L (ref 0–35)
AST: 13 U/L (ref 0–37)
CO2: 22 mEq/L (ref 19–32)
Calcium: 9.2 mg/dL (ref 8.4–10.5)
Chloride: 105 mEq/L (ref 96–112)
Creatinine, Ser: 0.56 mg/dL (ref 0.50–1.10)
GFR calc Af Amer: >90 mL/min (ref >90)
GFR calc non Af Amer: >90 mL/min (ref >90)
Glucose, Bld: 80 mg/dL (ref 70–99)
Sodium: 136 mEq/L (ref 135–145)
Total Bilirubin: 0.2 mg/dL — ABNORMAL LOW (ref 0.3–1.2)

## 2013-12-15 LAB — POCT FERN TEST

## 2013-12-15 LAB — PROTEIN / CREATININE RATIO, URINE: Creatinine, Urine: 120.54 mg/dL

## 2013-12-15 LAB — URINE CULTURE

## 2013-12-15 LAB — CBC
Hemoglobin: 10.1 g/dL — ABNORMAL LOW (ref 12.0–15.0)
MCH: 27.4 pg (ref 26.0–34.0)
MCV: 82.9 fL (ref 78.0–100.0)
Platelets: 216 10*3/uL (ref 150–400)
RBC: 3.69 MIL/uL — ABNORMAL LOW (ref 3.87–5.11)
RDW: 15.3 % (ref 11.5–15.5)
WBC: 10 10*3/uL (ref 4.0–10.5)

## 2013-12-15 LAB — AMNISURE RUPTURE OF MEMBRANE (ROM) NOT AT ARMC: Amnisure ROM: NEGATIVE

## 2013-12-15 MED ORDER — LACTATED RINGERS IV SOLN
Freq: Once | INTRAVENOUS | Status: AC
  Administered 2013-12-15: 18:00:00 via INTRAVENOUS

## 2013-12-15 MED ORDER — ACETAMINOPHEN 325 MG PO TABS
650.0000 mg | ORAL_TABLET | Freq: Once | ORAL | Status: AC
  Administered 2013-12-15: 650 mg via ORAL
  Filled 2013-12-15: qty 2

## 2013-12-15 NOTE — Progress Notes (Signed)
Dr rivard called back and ordered pih labs, a liter bolus of LR and urine protein/creatinine ratio

## 2013-12-15 NOTE — MAU Note (Signed)
Patient is in with c/o possible rupture of membranes this morning. She was seen 2 days with the same complaints. amnisure is negative. She denies vaginal bleeding or ctx. She reports good fetal movement.

## 2013-12-15 NOTE — MAU Provider Note (Signed)
History   17 yo, G1P0 at [redacted]w[redacted]d presents with c/o LOF starting this am. Recent visits to MAU for same complaints.  Denies VB, UCs, recent fever, resp or GI c/o's, UTI or PIH s/s. GFM.   Chief Complaint  Patient presents with  . Rupture of Membranes   HPI  OB History   Grav Para Term Preterm Abortions TAB SAB Ect Mult Living   1               Past Medical History  Diagnosis Date  . Fibromyalgia   . Chronic fatigue     Past Surgical History  Procedure Laterality Date  . No past surgeries      History reviewed. No pertinent family history.  History  Substance Use Topics  . Smoking status: Current Every Day Smoker -- 0.25 packs/day    Types: Cigarettes  . Smokeless tobacco: Not on file  . Alcohol Use: No    Allergies:  Allergies  Allergen Reactions  . Other Nausea And Vomiting and Other (See Comments)    Blue Cheese causes sweating nausea and vomitting  . Prednisone Other (See Comments)    Severe Depression  . Vicodin [Hydrocodone-Acetaminophen] Nausea And Vomiting    Prescriptions prior to admission  Medication Sig Dispense Refill  . Prenatal Vit-Fe Fumarate-FA (PRENATAL MULTIVITAMIN) TABS tablet Take 1 tablet by mouth daily at 12 noon.        ROS ROS: see HPI above, all other systems are negative  Physical Exam   Blood pressure 136/91, pulse 93, temperature 98.2 F (36.8 C), temperature source Oral, resp. rate 16, last menstrual period 03/09/2013.  Physical Exam Chest: Clear Heart: RRR Abdomen: gravid, NT Extremities: WNL  Dilation: Fingertip Effacement (%): 80 Cervical Position: Middle Station: -2 Exam by:: Jazmin Ley,CNM  FHT: Reactive NST UCs: None  Results for orders placed during the hospital encounter of 12/15/13 (from the past 24 hour(s))  PROTEIN / CREATININE RATIO, URINE     Status: None   Collection Time    12/15/13  4:50 PM      Result Value Range   Creatinine, Urine 120.54     Total Protein, Urine 11.3     PROTEIN CREATININE RATIO  0.09  0.00 - 0.15  POCT FERN TEST     Status: Normal   Collection Time    12/15/13  5:17 PM      Result Value Range   POCT Fern Test      AMNISURE RUPTURE OF MEMBRANE (ROM)     Status: None   Collection Time    12/15/13  6:05 PM      Result Value Range   Amnisure ROM NEGATIVE    CBC     Status: Abnormal   Collection Time    12/15/13  6:10 PM      Result Value Range   WBC 10.0  4.0 - 10.5 K/uL   RBC 3.69 (*) 3.87 - 5.11 MIL/uL   Hemoglobin 10.1 (*) 12.0 - 15.0 g/dL   HCT 78.2 (*) 95.6 - 21.3 %   MCV 82.9  78.0 - 100.0 fL   MCH 27.4  26.0 - 34.0 pg   MCHC 33.0  30.0 - 36.0 g/dL   RDW 08.6  57.8 - 46.9 %   Platelets 216  150 - 400 K/uL  COMPREHENSIVE METABOLIC PANEL     Status: Abnormal   Collection Time    12/15/13  6:10 PM      Result Value Range   Sodium  136  135 - 145 mEq/L   Potassium 4.1  3.5 - 5.1 mEq/L   Chloride 105  96 - 112 mEq/L   CO2 22  19 - 32 mEq/L   Glucose, Bld 80  70 - 99 mg/dL   BUN 9  6 - 23 mg/dL   Creatinine, Ser 1.47  0.50 - 1.10 mg/dL   Calcium 9.2  8.4 - 82.9 mg/dL   Total Protein 5.5 (*) 6.0 - 8.3 g/dL   Albumin 2.3 (*) 3.5 - 5.2 g/dL   AST 13  0 - 37 U/L   ALT 10  0 - 35 U/L   Alkaline Phosphatase 203 (*) 39 - 117 U/L   Total Bilirubin 0.2 (*) 0.3 - 1.2 mg/dL   GFR calc non Af Amer >90  >90 mL/min   GFR calc Af Amer >90  >90 mL/min  URIC ACID     Status: None   Collection Time    12/15/13  6:10 PM      Result Value Range   Uric Acid, Serum 5.7  2.4 - 7.0 mg/dL   ED Course  IUP at [redacted]w[redacted]d ? ROM Elevated BP noted with HA in MAU   PIH labs - WNL Amnisure - Neg LR bolus Tylenol and meal for HA  C/w Dr. Dion Body HA improved  D/c home with precautions [facility] instruction F/u 12/24 with an already scheduled ROB   Haroldine Laws CNM, MSN 12/15/2013 7:48 PM

## 2013-12-15 NOTE — Progress Notes (Signed)
Dr. Estanislado Pandy called back and was notified of patient complains of leaking fluids, she is aware of negative fern slide, BP readings (pt stating that the are high for her), headache, tracing and request to review tracing. Dr. Estanislado Pandy will review tracing and call back. Order to obtain [facility].

## 2013-12-15 NOTE — Progress Notes (Signed)
Message was left for Dr Estanislado Pandy

## 2013-12-16 ENCOUNTER — Encounter (HOSPITAL_COMMUNITY): Payer: Self-pay

## 2013-12-16 ENCOUNTER — Inpatient Hospital Stay (HOSPITAL_COMMUNITY)
Admission: AD | Admit: 2013-12-16 | Discharge: 2013-12-16 | Disposition: A | Discharge status: Home / Self Care | Payer: Medicaid Other | Source: Ambulatory Visit | Attending: Obstetrics and Gynecology | Admitting: Obstetrics and Gynecology

## 2013-12-16 DIAGNOSIS — O4700 False labor before 37 completed weeks of gestation, unspecified trimester: Secondary | ICD-10-CM | POA: Insufficient documentation

## 2013-12-16 DIAGNOSIS — O99330 Smoking (tobacco) complicating pregnancy, unspecified trimester: Secondary | ICD-10-CM | POA: Insufficient documentation

## 2013-12-16 HISTORY — DX: Unspecified asthma, uncomplicated: J45.909

## 2013-12-16 NOTE — MAU Note (Signed)
Pt c/o intense uc x 2 hours that are 2-3 mins. Denies LOF. +FM and bloody show. In office today and was 1cm.

## 2013-12-16 NOTE — MAU Provider Note (Signed)
History   17yo, G1P0 at [redacted]w[redacted]d presents with UCs since this am, Q 2-3 min.  Reports bloody show since this am.  Seen in office today.  Denies LOF, recent fever, resp or GI c/o's, UTI or PIH s/s. GFM.   Chief Complaint  Patient presents with  . Labor Eval   HPI  OB History   Grav Para Term Preterm Abortions TAB SAB Ect Mult Living   1               Past Medical History  Diagnosis Date  . Fibromyalgia   . Chronic fatigue   . Asthma     exercise induced    Past Surgical History  Procedure Laterality Date  . No past surgeries      History reviewed. No pertinent family history.  History  Substance Use Topics  . Smoking status: Current Every Day Smoker -- 0.25 packs/day    Types: Cigarettes  . Smokeless tobacco: Not on file  . Alcohol Use: No    Allergies:  Allergies  Allergen Reactions  . Other Nausea And Vomiting and Other (See Comments)    Blue Cheese causes sweating nausea and vomitting  . Prednisone Other (See Comments)    Severe Depression  . Vicodin [Hydrocodone-Acetaminophen] Nausea And Vomiting    Prescriptions prior to admission  Medication Sig Dispense Refill  . Prenatal Vit-Fe Fumarate-FA (PRENATAL MULTIVITAMIN) TABS tablet Take 1 tablet by mouth daily at 12 noon.        ROS ROS: see HPI above, all other systems are negative  Physical Exam   Blood pressure 131/86, pulse 99, temperature 98 F (36.7 C), temperature source Oral, resp. rate 18, last menstrual period 03/09/2013, SpO2 99.00%.  Physical Exam Chest: Clear Heart: RRR Abdomen: gravid, NT Extremities: WNL  Dilation: Fingertip Effacement (%): 60;70 Cervical Position: Middle Station: -2 Presentation: Vertex Exam by:: J Ciarah Peace CNM  FHT: Reactive NST UCs:  ED Course  IUP at [redacted]w[redacted]d Labor check  No cervical change after walking for 45 min D/c home with precautions F/u with already scheduled ROB on 1/7   Haroldine Laws CNM, MSN 12/16/2013 10:39 PM

## 2013-12-17 ENCOUNTER — Inpatient Hospital Stay (HOSPITAL_COMMUNITY)
Admission: AD | Admit: 2013-12-17 | Discharge: 2013-12-17 | Disposition: A | Discharge status: Home / Self Care | Payer: Medicaid Other | Source: Ambulatory Visit | Attending: Obstetrics and Gynecology | Admitting: Obstetrics and Gynecology

## 2013-12-17 ENCOUNTER — Encounter (HOSPITAL_COMMUNITY): Payer: Self-pay | Admitting: *Deleted

## 2013-12-17 DIAGNOSIS — D649 Anemia, unspecified: Secondary | ICD-10-CM | POA: Diagnosis present

## 2013-12-17 DIAGNOSIS — O9902 Anemia complicating childbirth: Secondary | ICD-10-CM | POA: Diagnosis present

## 2013-12-17 DIAGNOSIS — O99334 Smoking (tobacco) complicating childbirth: Secondary | ICD-10-CM | POA: Diagnosis present

## 2013-12-17 MED ORDER — NALBUPHINE HCL 10 MG/ML IJ SOLN
10.0000 mg | Freq: Once | INTRAMUSCULAR | Status: AC
  Administered 2013-12-17: 10 mg via INTRAMUSCULAR
  Filled 2013-12-17: qty 1

## 2013-12-17 MED ORDER — PROMETHAZINE HCL 25 MG/ML IJ SOLN
25.0000 mg | Freq: Once | INTRAMUSCULAR | Status: AC
  Administered 2013-12-17: 25 mg via INTRAMUSCULAR
  Filled 2013-12-17: qty 1

## 2013-12-17 NOTE — Progress Notes (Signed)
Pt states she has soft bm x2 days

## 2013-12-17 NOTE — MAU Note (Signed)
Pt's family member states contractions have become stronger and closer together since last night

## 2013-12-17 NOTE — MAU Provider Note (Signed)
  History     CSN: [id number]  Arrival date and time: 12/17/13 1717   None     Chief Complaint  Patient presents with  . Labor Eval   HPI Comments: Pt is a G1P0 at 37wks arrives after calling CNM w c/o worsening pain with ctx, denies any LOF or VB, +FM. Pt was seen in MAU for labor eval on 12/24.       Past Medical History  Diagnosis Date  . Fibromyalgia   . Chronic fatigue   . Asthma     exercise induced    Past Surgical History  Procedure Laterality Date  . No past surgeries      History reviewed. No pertinent family history.  History  Substance Use Topics  . Smoking status: Current Every Day Smoker -- 0.25 packs/day    Types: Cigarettes  . Smokeless tobacco: Not on file  . Alcohol Use: No    Allergies:  Allergies  Allergen Reactions  . Other Nausea And Vomiting and Other (See Comments)    Blue Cheese causes sweating nausea and vomitting  . Prednisone Other (See Comments)    Severe Depression  . Vicodin [Hydrocodone-Acetaminophen] Nausea And Vomiting    Prescriptions prior to admission  Medication Sig Dispense Refill  . Prenatal Vit-Fe Fumarate-FA (PRENATAL MULTIVITAMIN) TABS tablet Take 1 tablet by mouth daily at 12 noon.        Review of Systems  All other systems reviewed and are negative.   Physical Exam   Blood pressure 135/81, pulse 81, temperature 98.5 F (36.9 C), temperature source Oral, resp. rate 16, last menstrual period 03/09/2013.  Physical Exam  Nursing note and vitals reviewed. Constitutional: She is oriented to person, place, and time. She appears well-developed and well-nourished. She appears distressed.  Grimacing and moaning w some ctx, requests pain meds   Cardiovascular: Normal rate.   Respiratory: Effort normal.  GI: Soft. There is no tenderness.  Genitourinary: Vagina normal.  No cervical change after 1hr observation  Musculoskeletal: Normal range of motion.  Neurological: She is alert and oriented to person, place,  and time. She has normal reflexes.  Skin: Skin is warm and dry.  Psychiatric: She has a normal mood and affect. Her behavior is normal.    MAU Course  Procedures     Assessment and Plan  IUP at [redacted]w[redacted]d FHR cat 1 toco - not tracing well, ctx not palpable  Cervix 1cm/70-80%/-1, vtx Not in active labor  nubain 10mg /phenergan 25mg  IM given  Pt getting some relief after 20 min  OK for discharge rv'd FKC and labor sx's  dc'd home in stable condition w pt's dad   Malissa Hippo 12/17/2013, 7:33 PM

## 2013-12-17 NOTE — MAU Note (Signed)
Was here last night. (was almost a full cm). Contractions have gotten stronger and closer, now every 3-4 min. Small amt of bloody show this morning. No water leaking.

## 2013-12-17 NOTE — Progress Notes (Signed)
Pt taken off monitior

## 2013-12-18 ENCOUNTER — Inpatient Hospital Stay (HOSPITAL_COMMUNITY)
Admission: AD | Admit: 2013-12-18 | Discharge: 2013-12-20 | DRG: 775 | Disposition: A | Discharge status: Home / Self Care | Payer: Medicaid Other | Source: Ambulatory Visit | Attending: Obstetrics and Gynecology | Admitting: Obstetrics and Gynecology

## 2013-12-18 ENCOUNTER — Encounter (HOSPITAL_COMMUNITY): Payer: Self-pay | Admitting: *Deleted

## 2013-12-18 ENCOUNTER — Inpatient Hospital Stay (HOSPITAL_COMMUNITY): Payer: Medicaid Other | Admitting: Anesthesiology

## 2013-12-18 ENCOUNTER — Encounter (HOSPITAL_COMMUNITY): Payer: Medicaid Other | Admitting: Anesthesiology

## 2013-12-18 LAB — COMPREHENSIVE METABOLIC PANEL
AST: 13 U/L (ref 0–37)
Alkaline Phosphatase: 196 U/L — ABNORMAL HIGH (ref 39–117)
BUN: 7 mg/dL (ref 6–23)
Calcium: 8.9 mg/dL (ref 8.4–10.5)
Chloride: 105 mEq/L (ref 96–112)
Creatinine, Ser: 0.54 mg/dL (ref 0.50–1.10)
GFR calc Af Amer: >90 mL/min (ref >90)
GFR calc non Af Amer: >90 mL/min (ref >90)
Glucose, Bld: 88 mg/dL (ref 70–99)
Potassium: 4.1 mEq/L (ref 3.5–5.1)
Sodium: 137 mEq/L (ref 135–145)
Total Bilirubin: 0.2 mg/dL — ABNORMAL LOW (ref 0.3–1.2)

## 2013-12-18 LAB — ABO/RH: ABO/RH(D): O POS

## 2013-12-18 LAB — TYPE AND SCREEN: Antibody Screen: NEGATIVE

## 2013-12-18 LAB — CBC
HCT: 33.5 % — ABNORMAL LOW (ref 36.0–46.0)
Hemoglobin: 11 g/dL — ABNORMAL LOW (ref 12.0–15.0)
MCHC: 32.8 g/dL (ref 30.0–36.0)
Platelets: 242 10*3/uL (ref 150–400)
Platelets: 246 10*3/uL (ref 150–400)
RBC: 3.75 MIL/uL — ABNORMAL LOW (ref 3.87–5.11)
RDW: 15.6 % — ABNORMAL HIGH (ref 11.5–15.5)
WBC: 13.7 10*3/uL — ABNORMAL HIGH (ref 4.0–10.5)
WBC: 12.7 10*3/uL — ABNORMAL HIGH (ref 4.0–10.5)

## 2013-12-18 LAB — URIC ACID: Uric Acid, Serum: 5.6 mg/dL (ref 2.4–7.0)

## 2013-12-18 LAB — PROTEIN / CREATININE RATIO, URINE
Creatinine, Urine: 116.26 mg/dL
Protein Creatinine Ratio: 0.18 — ABNORMAL HIGH (ref 0.00–0.15)

## 2013-12-18 LAB — RPR: RPR Ser Ql: NONREACTIVE

## 2013-12-18 MED ORDER — SENNOSIDES-DOCUSATE SODIUM 8.6-50 MG PO TABS
2.0000 | ORAL_TABLET | ORAL | Status: DC
  Administered 2013-12-18 – 2013-12-20 (×2): 2 via ORAL
  Filled 2013-12-18 (×2): qty 2

## 2013-12-18 MED ORDER — LIDOCAINE HCL (PF) 1 % IJ SOLN
30.0000 mL | INTRAMUSCULAR | Status: AC | PRN
  Administered 2013-12-18: 30 mL via SUBCUTANEOUS
  Filled 2013-12-18 (×2): qty 30

## 2013-12-18 MED ORDER — ONDANSETRON HCL 4 MG/2ML IJ SOLN: 4.0000 mg | Freq: Four times a day (QID) | INTRAMUSCULAR | Status: DC | PRN

## 2013-12-18 MED ORDER — DIPHENHYDRAMINE HCL 50 MG/ML IJ SOLN: 12.5000 mg | INTRAMUSCULAR | Status: DC | PRN

## 2013-12-18 MED ORDER — DIBUCAINE 1 % RE OINT: 1.0000 "application " | TOPICAL_OINTMENT | RECTAL | Status: DC | PRN

## 2013-12-18 MED ORDER — OXYCODONE-ACETAMINOPHEN 5-325 MG PO TABS: 1.0000 | ORAL_TABLET | ORAL | Status: DC | PRN

## 2013-12-18 MED ORDER — IBUPROFEN 600 MG PO TABS
600.0000 mg | ORAL_TABLET | Freq: Four times a day (QID) | ORAL | Status: DC
  Administered 2013-12-18 – 2013-12-20 (×9): 600 mg via ORAL
  Filled 2013-12-18 (×9): qty 1

## 2013-12-18 MED ORDER — ACETAMINOPHEN 325 MG PO TABS: 650.0000 mg | ORAL_TABLET | ORAL | Status: DC | PRN

## 2013-12-18 MED ORDER — TETANUS-DIPHTH-ACELL PERTUSSIS 5-2.5-18.5 LF-MCG/0.5 IM SUSP: 0.5000 mL | Freq: Once | INTRAMUSCULAR | Status: DC

## 2013-12-18 MED ORDER — LACTATED RINGERS IV SOLN
500.0000 mL | Freq: Once | INTRAVENOUS | Status: AC
  Administered 2013-12-18: 500 mL via INTRAVENOUS

## 2013-12-18 MED ORDER — WITCH HAZEL-GLYCERIN EX PADS: 1.0000 "application " | MEDICATED_PAD | CUTANEOUS | Status: DC | PRN

## 2013-12-18 MED ORDER — FENTANYL CITRATE 0.05 MG/ML IJ SOLN
100.0000 ug | INTRAMUSCULAR | Status: DC | PRN
  Administered 2013-12-18: 100 ug via INTRAVENOUS
  Filled 2013-12-18: qty 2

## 2013-12-18 MED ORDER — BENZOCAINE-MENTHOL 20-0.5 % EX AERO: 1.0000 "application " | INHALATION_SPRAY | CUTANEOUS | Status: DC | PRN

## 2013-12-18 MED ORDER — LACTATED RINGERS IV SOLN: 500.0000 mL | INTRAVENOUS | Status: DC | PRN

## 2013-12-18 MED ORDER — PROMETHAZINE HCL 25 MG/ML IJ SOLN
25.0000 mg | Freq: Once | INTRAMUSCULAR | Status: AC
  Administered 2013-12-18: 25 mg via INTRAVENOUS
  Filled 2013-12-18: qty 1

## 2013-12-18 MED ORDER — PHENYLEPHRINE 40 MCG/ML (10ML) SYRINGE FOR IV PUSH (FOR BLOOD PRESSURE SUPPORT)
80.0000 ug | PREFILLED_SYRINGE | INTRAVENOUS | Status: DC | PRN
  Filled 2013-12-18: qty 10
  Filled 2013-12-18: qty 2

## 2013-12-18 MED ORDER — ONDANSETRON HCL 4 MG/2ML IJ SOLN: 4.0000 mg | INTRAMUSCULAR | Status: DC | PRN

## 2013-12-18 MED ORDER — FENTANYL 2.5 MCG/ML BUPIVACAINE 1/10 % EPIDURAL INFUSION (WH - ANES)
14.0000 mL/h | INTRAMUSCULAR | Status: DC | PRN
  Administered 2013-12-18 (×2): 14 mL/h via EPIDURAL
  Filled 2013-12-18 (×2): qty 125

## 2013-12-18 MED ORDER — PHENYLEPHRINE 40 MCG/ML (10ML) SYRINGE FOR IV PUSH (FOR BLOOD PRESSURE SUPPORT)
80.0000 ug | PREFILLED_SYRINGE | INTRAVENOUS | Status: DC | PRN
  Filled 2013-12-18: qty 2

## 2013-12-18 MED ORDER — LANOLIN HYDROUS EX OINT: TOPICAL_OINTMENT | CUTANEOUS | Status: DC | PRN

## 2013-12-18 MED ORDER — PRENATAL MULTIVITAMIN CH
1.0000 | ORAL_TABLET | Freq: Every day | ORAL | Status: DC
  Administered 2013-12-19 – 2013-12-20 (×2): 1 via ORAL
  Filled 2013-12-18 (×2): qty 1

## 2013-12-18 MED ORDER — LACTATED RINGERS IV BOLUS (SEPSIS)
1000.0000 mL | Freq: Once | INTRAVENOUS | Status: AC
  Administered 2013-12-18: 1000 mL via INTRAVENOUS

## 2013-12-18 MED ORDER — LIDOCAINE HCL (PF) 1 % IJ SOLN
INTRAMUSCULAR | Status: DC | PRN
  Administered 2013-12-18 (×4): 4 mL

## 2013-12-18 MED ORDER — OXYTOCIN BOLUS FROM INFUSION: 500.0000 mL | INTRAVENOUS | Status: DC

## 2013-12-18 MED ORDER — ONDANSETRON HCL 4 MG PO TABS: 4.0000 mg | ORAL_TABLET | ORAL | Status: DC | PRN

## 2013-12-18 MED ORDER — CITRIC ACID-SODIUM CITRATE 334-500 MG/5ML PO SOLN: 30.0000 mL | ORAL | Status: DC | PRN

## 2013-12-18 MED ORDER — EPHEDRINE 5 MG/ML INJ
10.0000 mg | INTRAVENOUS | Status: DC | PRN
  Filled 2013-12-18: qty 2

## 2013-12-18 MED ORDER — DIPHENHYDRAMINE HCL 25 MG PO CAPS: 25.0000 mg | ORAL_CAPSULE | Freq: Four times a day (QID) | ORAL | Status: DC | PRN

## 2013-12-18 MED ORDER — ZOLPIDEM TARTRATE 5 MG PO TABS: 5.0000 mg | ORAL_TABLET | Freq: Every evening | ORAL | Status: DC | PRN

## 2013-12-18 MED ORDER — EPHEDRINE 5 MG/ML INJ
10.0000 mg | INTRAVENOUS | Status: DC | PRN
  Filled 2013-12-18: qty 2
  Filled 2013-12-18: qty 4

## 2013-12-18 MED ORDER — BUTORPHANOL TARTRATE 1 MG/ML IJ SOLN
2.0000 mg | Freq: Once | INTRAMUSCULAR | Status: AC
  Administered 2013-12-18: 2 mg via INTRAVENOUS
  Filled 2013-12-18: qty 2

## 2013-12-18 MED ORDER — OXYTOCIN 40 UNITS IN LACTATED RINGERS INFUSION - SIMPLE MED
62.5000 mL/h | INTRAVENOUS | Status: DC
  Administered 2013-12-18: 62.5 mL/h via INTRAVENOUS
  Filled 2013-12-18: qty 1000

## 2013-12-18 MED ORDER — LACTATED RINGERS IV SOLN
INTRAVENOUS | Status: DC
  Administered 2013-12-18: 05:00:00 via INTRAVENOUS

## 2013-12-18 MED ORDER — SIMETHICONE 80 MG PO CHEW: 80.0000 mg | CHEWABLE_TABLET | ORAL | Status: DC | PRN

## 2013-12-18 MED ORDER — IBUPROFEN 600 MG PO TABS: 600.0000 mg | ORAL_TABLET | Freq: Four times a day (QID) | ORAL | Status: DC | PRN

## 2013-12-18 NOTE — Lactation Note (Signed)
This note was copied from the chart of [name]. Lactation Consultation Note  Patient Name: [name] ZOXWR'U Date: 12/18/2013 Reason for consult: Initial assessment;Infant < 6lbs Asked by Dr. Ezequiel Essex to assist Mom in L&D, baby rooting but Mom having trouble getting baby to latch. Baby STS when I arrived. Assisted Mom with positioning and latching baby in cross cradle. Mom's nipple flattens with breast compression but baby can obtain and sustain latch with assist. Demonstrated to Mom and FOB how to sandwich nipple to help with latch and to continue to support breast for baby to sustain latch. Baby demonstrates a good rhythmic suck but will lose the depth when relaxing which requires re-latching at times. Reviewed with Mom to ask for assist while she is learning to latch baby. Encouraged to BF with feeding ques, at least every 3 hours. Lactation brochure left for review, advised of OP services and support group. Encouraged to keep baby STS when awake. Ask for assist as discussed.   Maternal Data Formula Feeding for Exclusion: No Infant to breast within first hour of birth: Yes Has patient been taught Hand Expression?: Yes Does the patient have breastfeeding experience prior to this delivery?: No  Feeding Feeding Type: Breast Fed Length of feed: 15 min (on and off)  LATCH Score/Interventions Latch: Repeated attempts needed to sustain latch, nipple held in mouth throughout feeding, stimulation needed to elicit sucking reflex. Intervention(s): Adjust position;Assist with latch;Breast massage;Breast compression  Audible Swallowing: A few with stimulation  Type of Nipple: Flat  Comfort (Breast/Nipple): Soft / non-tender     Hold (Positioning): Assistance needed to correctly position infant at breast and maintain latch. Intervention(s): Breastfeeding basics reviewed;Support Pillows;Position options;Skin to skin  LATCH Score: 6  Lactation Tools Discussed/Used WIC  Program: No   Consult Status Consult Status: Follow-up Date: 12/19/13 Follow-up type: In-patient    [name] 12/18/2013, 5:28 PM

## 2013-12-18 NOTE — Anesthesia Preprocedure Evaluation (Signed)
Anesthesia Evaluation  Patient identified by MRN, date of birth, ID band Patient awake    Reviewed: Allergy & Precautions, H&P , NPO status , Patient's Chart, lab work & pertinent test results, reviewed documented beta blocker date and time   History of Anesthesia Complications Negative for: history of anesthetic complications  Airway Mallampati: I TM Distance: >3 FB Neck ROM: full    Dental  (+) Teeth Intact   Pulmonary asthma (no recent inhaler use) , Current Smoker,  breath sounds clear to auscultation        Cardiovascular negative cardio ROS  Rhythm:regular Rate:Normal     Neuro/Psych  Headaches, negative psych ROS   GI/Hepatic negative GI ROS, Neg liver ROS,   Endo/Other  negative endocrine ROS  Renal/GU negative Renal ROS     Musculoskeletal  (+) Fibromyalgia - (chronic fatigue)  Abdominal   Peds  Hematology  (+) anemia ,   Anesthesia Other Findings   Reproductive/Obstetrics (+) Pregnancy                           Anesthesia Physical Anesthesia Plan  ASA: II  Anesthesia Plan: Epidural   Post-op Pain Management:    Induction:   Airway Management Planned:   Additional Equipment:   Intra-op Plan:   Post-operative Plan:   Informed Consent: I have reviewed the patients History and Physical, chart, labs and discussed the procedure including the risks, benefits and alternatives for the proposed anesthesia with the patient or authorized representative who has indicated his/her understanding and acceptance.     Plan Discussed with:   Anesthesia Plan Comments:         Anesthesia Quick Evaluation

## 2013-12-18 NOTE — MAU Note (Signed)
contractions 

## 2013-12-18 NOTE — H&P (Signed)
[name] is a 17 y.o. female presenting for labor eval, pt returned to MAU at about 245am, after being discharged last evening about 730pm. Pt is moaning and crying out. States injection she rcv'd earlier helped for a little while, but now ctx feel even worse. Denies any LOF or VB, +FM.  MAU course:  Cervix was unchanged  Pt rcv'd IVF's and 2mg  stadol IVP, was able to rest between ctx but was still crying out with ctx, then  phenergan 25mg  IVP was given.  Pt continued to scream and cry out w ctx,   At 0500, cervix was 1.5/90/-1, fentanyl IVP given without much relief, pt very sleepy, but continued to cry out w ctx  At 0600, cervix 1.5/100/01 (all vag exams done by S.L.)  FHR remained cat 1 toco q 3-4   Pregnancy course: Pt began [facility] at 12wks [facility] =01/07/14 by 12wk [place], changed from 1/28 (LMP)  1st trim aneuploidy screen WNL AFP normal  Anatomy US at 19wks WNL 1hr gtt normal       rcv'd FLU vaccine and TDAP - 11/6   Maternal Medical History:  Reason for admission: Contractions.   Contractions: Onset was 2 days ago.   Frequency: regular.   Duration is approximately 60 seconds.   Perceived severity is strong.    Fetal activity: Perceived fetal activity is normal.   Last perceived fetal movement was within the past hour.    Prenatal complications: no prenatal complications Prenatal Complications - Diabetes: none.    OB History   Grav Para Term Preterm Abortions TAB SAB Ect Mult Living   1              Past Medical History  Diagnosis Date  . Fibromyalgia   . Chronic fatigue   . Asthma     exercise induced   Past Surgical History  Procedure Laterality Date  . No past surgeries     Family History: family history is not on file. Social History:  reports that she has been smoking Cigarettes.  She has been smoking about 0.25 packs per day. She does not have any smokeless tobacco history on file. She reports that she does not drink alcohol or use  illicit drugs.   Prenatal Transfer Tool  Maternal Diabetes: No Genetic Screening: Normal Maternal Ultrasounds/Referrals: Normal Fetal Ultrasounds or other Referrals:  None Maternal Substance Abuse:  No Significant Maternal Medications:  None Significant Maternal Lab Results:  Lab values include: Group B Strep negative Other Comments:  None  Review of Systems  All other systems reviewed and are negative.    Dilation: 1.5 Effacement (%): 100 Station: -1 Exam by:: [name],CNM Blood pressure 135/80, pulse 82, temperature 98.4 F (36.9 C), temperature source Oral, resp. rate 20, last menstrual period 03/09/2013, SpO2 100.00%. Maternal Exam:  Uterine Assessment: Contraction strength is mild.  Contraction duration is 60 seconds. Contraction frequency is regular.   Abdomen: Patient reports no abdominal tenderness. Fundal height is aga.   Estimated fetal weight is 6#.   Fetal presentation: vertex  Introitus: Normal vulva. Normal vagina.  Pelvis: adequate for delivery.   Cervix: Cervix evaluated by digital exam.     Fetal Exam Fetal Monitor Review: Mode: ultrasound.    Fetal State Assessment: Category I - tracings are normal.     Physical Exam  Nursing note and vitals reviewed. Constitutional: She is oriented to person, place, and time. She appears well-developed and well-nourished. She appears distressed.  HENT:  Head: Normocephalic.  Eyes: Pupils are equal, round, and reactive to light.  Neck: Normal range of motion.  Cardiovascular: Normal rate, regular rhythm and normal heart sounds.   Respiratory: Effort normal and breath sounds normal.  GI: Soft. Bowel sounds are normal.  Genitourinary: Vagina normal.  Musculoskeletal: Normal range of motion.  Neurological: She is alert and oriented to person, place, and time. She has normal reflexes.  Skin: Skin is warm and dry.  Psychiatric: She has a normal mood and affect. Her behavior is normal.    Prenatal labs: ABO, Rh:    O pos 6/2 Antibody:   neg 6/2 Rubella:   Immune 6/2 RPR:   NR 6/2  HBsAg:   neg 6/2  HIV:   neg 6/2 GBS:   neg 12/16  GC/CT neg 6/2 GC/CT neg 12/16   Assessment/Plan: IUP at [redacted]w[redacted]d Early vs. Latent labor  GBS neg FHR cat 1 Pt not tolerating ctx despite minimal cervical change  D/w [name] and epidural not recommended at this point Will admit to b.s. For continued observation of labor status Routine L&D orders   [name] 12/18/2013, 6:45 AM

## 2013-12-18 NOTE — Lactation Note (Signed)
This note was copied from the chart of [name]. Lactation Consultation Note  Patient Name: [name] ZOXWR'U Date: 12/18/2013 Reason for consult: Follow-up assessment;Infant < 6lbs Mom called for [facility] assist with latching baby. Baby attempted to latch few times but too sleepy and would not latch. Tried #20 nipple shield but baby was too sleepy. Set up DEBP for Mom to pre-pump for 3-5 minutes to bring nipples out and get her milk flow going. Few drops of colostrum visible with hand expression. Advised to post pump every 3 hours on preemie setting to encourage milk production. Set up/cleaning of pump reviewed. Use nipple shield if unable to get baby latched. Call and ask for assist with feedings.   Maternal Data    Feeding Feeding Type: Breast Fed Length of feed: 0 min  LATCH Score/Interventions Latch: Too sleepy or reluctant, no latch achieved, no sucking elicited.  Audible Swallowing: None  Type of Nipple: Flat  Comfort (Breast/Nipple): Soft / non-tender     Hold (Positioning): Assistance needed to correctly position infant at breast and maintain latch.  LATCH Score: 4  Lactation Tools Discussed/Used Tools: Nipple Dorris Carnes;Pump Nipple shield size: 20 Breast pump type: Double-Electric Breast Pump   Consult Status Date: 12/19/13 Follow-up type: In-patient    [name] 12/18/2013, 10:22 PM

## 2013-12-18 NOTE — Anesthesia Procedure Notes (Signed)
Epidural Patient location during procedure: OB Start time: 12/18/2013 8:35 AM  Staffing Performed by: anesthesiologist   Preanesthetic Checklist Completed: patient identified, site marked, surgical consent, pre-op evaluation, timeout performed, IV checked, risks and benefits discussed and monitors and equipment checked  Epidural Patient position: sitting Prep: site prepped and draped and DuraPrep Patient monitoring: continuous pulse ox and blood pressure Approach: midline Injection technique: LOR air  Needle:  Needle type: Tuohy  Needle gauge: 17 G Needle length: 9 cm and 9 Needle insertion depth: 6 cm Catheter type: closed end flexible Catheter size: 19 Gauge Catheter at skin depth: 11 cm Test dose: negative  Assessment Events: blood not aspirated, injection not painful, no injection resistance, negative IV test and no paresthesia  Additional Notes Discussed risk of headache, infection, bleeding, nerve injury and failed or incomplete block.  Patient voices understanding and wishes to proceed.  Epidural placed easily on first pass.  No paresthesia.  Patient tolerated procedure well with no apparent complications.  Jasmine December, MDReason for block:procedure for pain

## 2013-12-18 NOTE — Progress Notes (Signed)
  Subjective: Now on Birthing Suite--FOB at bedside.  Patient still screaming out with each contraction.  Received following meds in MAU, with minimal benefit: Stadol 2 mg IV at 3:12a Phenergan 25 mg IV at 4:10a Fentanyl 100 mcg at 5:27a  Objective: BP 147/93  Pulse 75  Temp(Src) 98.4 F (36.9 C) (Oral)  Resp 20  SpO2 99%  LMP 03/09/2013      Filed Vitals:   12/18/13 0716 12/18/13 0721 12/18/13 0742 12/18/13 0744  BP:   147/93 147/93  Pulse: 108 64 75 75  Temp:      TempSrc:      Resp:      SpO2: 100% 99%     No hx elevated BP.  FHT:  Category 1 UC:   regular, every SVE:   Dilation: 3 Effacement (%): 100 Station: -2 Exam by:: Theadore Blunck,,CNM BBOW.  Cervix very tight.  Assessment / Plan: Early labor Difficulty coping with labor, despite support and pain med.  Plan: Epidural now.  Nigel Bridgeman 12/18/2013, 7:55 AM

## 2013-12-19 LAB — CBC
Hemoglobin: 8.7 g/dL — ABNORMAL LOW (ref 12.0–15.0)
MCHC: 32.1 g/dL (ref 30.0–36.0)
RBC: 3.22 MIL/uL — ABNORMAL LOW (ref 3.87–5.11)
WBC: 10 10*3/uL (ref 4.0–10.5)

## 2013-12-19 MED ORDER — FERROUS SULFATE 325 (65 FE) MG PO TABS
325.0000 mg | ORAL_TABLET | Freq: Two times a day (BID) | ORAL | Status: DC
  Administered 2013-12-19 – 2013-12-20 (×4): 325 mg via ORAL
  Filled 2013-12-19 (×4): qty 1

## 2013-12-19 NOTE — Progress Notes (Signed)
Post Partum Day 1:S/P SVB, 1st degree labia minora and periurethral laceration Subjective: Patient up ad lib, denies syncope or dizziness. Feeding:  Breast Contraceptive plan:   Undecided  Objective: Blood pressure 125/73, pulse 82, temperature 97.3 F (36.3 C), temperature source Oral, resp. rate 18, last menstrual period 03/09/2013, SpO2 100.00%, unknown if currently breastfeeding.  Physical Exam:  General: alert Lochia: appropriate Uterine Fundus: firm Incision: healing well DVT Evaluation: No evidence of DVT seen on physical exam.   Recent Labs  12/18/13 1442 12/19/13 0620  HGB 10.4* 8.7*  HCT 31.1* 26.8*    Assessment/Plan: S/P Vaginal delivery day 1 Continue current care Check orthostatic BP/pulses Rx Fe Reviewed contraceptive options Plan for discharge tomorrow   LOS: 1 day   Eragon Hammond 12/19/2013, 10:09 AM

## 2013-12-19 NOTE — Anesthesia Postprocedure Evaluation (Signed)
Anesthesia Post Note  Patient: [name]  Procedure(s) Performed: * No procedures listed *  Anesthesia type: Epidural  Patient location: Mother/Baby  Post pain: Pain level controlled  Post assessment: Post-op Vital signs reviewed  Last Vitals:  Filed Vitals:   12/18/13 2220  BP: 125/73  Pulse: 82  Temp: 36.3 C  Resp: 18    Post vital signs: Reviewed  Level of consciousness:alert  Complications: No apparent anesthesia complications

## 2013-12-20 ENCOUNTER — Encounter (HOSPITAL_COMMUNITY): Payer: Medicaid Other

## 2013-12-20 ENCOUNTER — Encounter (HOSPITAL_COMMUNITY)
Admission: RE | Admit: 2013-12-20 | Discharge: 2013-12-20 | Disposition: A | Discharge status: Home / Self Care | Payer: Medicaid Other | Source: Ambulatory Visit | Attending: Obstetrics and Gynecology | Admitting: Obstetrics and Gynecology

## 2013-12-20 DIAGNOSIS — O923 Agalactia: Secondary | ICD-10-CM | POA: Insufficient documentation

## 2013-12-20 LAB — COMPREHENSIVE METABOLIC PANEL
ALT: 11 U/L (ref 0–35)
AST: 16 U/L (ref 0–37)
Albumin: 2.3 g/dL — ABNORMAL LOW (ref 3.5–5.2)
BUN: 9 mg/dL (ref 6–23)
CO2: 25 mEq/L (ref 19–32)
Calcium: 9 mg/dL (ref 8.4–10.5)
Creatinine, Ser: 0.73 mg/dL (ref 0.50–1.10)
GFR calc non Af Amer: >90 mL/min (ref >90)
Sodium: 137 mEq/L (ref 135–145)
Total Protein: 5.4 g/dL — ABNORMAL LOW (ref 6.0–8.3)

## 2013-12-20 LAB — CBC
MCH: 27.5 pg (ref 26.0–34.0)
MCHC: 33 g/dL (ref 30.0–36.0)
MCV: 83.3 fL (ref 78.0–100.0)
Platelets: 205 10*3/uL (ref 150–400)
RBC: 3.42 MIL/uL — ABNORMAL LOW (ref 3.87–5.11)
RDW: 15.9 % — ABNORMAL HIGH (ref 11.5–15.5)

## 2013-12-20 LAB — PROTEIN / CREATININE RATIO, URINE
Creatinine, Urine: 37.29 mg/dL
Total Protein, Urine: 4 mg/dL

## 2013-12-20 LAB — LACTATE DEHYDROGENASE: LDH: 191 U/L (ref 94–250)

## 2013-12-20 LAB — URIC ACID: Uric Acid, Serum: 5.2 mg/dL (ref 2.4–7.0)

## 2013-12-20 MED ORDER — FERROUS SULFATE 325 (65 FE) MG PO TABS: 325.0000 mg | ORAL_TABLET | Freq: Two times a day (BID) | ORAL | Status: DC

## 2013-12-20 MED ORDER — IBUPROFEN 600 MG PO TABS: 600.0000 mg | ORAL_TABLET | Freq: Four times a day (QID) | ORAL | Status: DC

## 2013-12-20 MED ORDER — OXYCODONE-ACETAMINOPHEN 5-325 MG PO TABS: 1.0000 | ORAL_TABLET | ORAL | Status: DC | PRN

## 2013-12-20 NOTE — Discharge Summary (Signed)
Vaginal Delivery Discharge Summary  [name]  DOB:    06-19-1996 MRN:    [id number] CSN:    [id number]  Date of admission:                  12/18/13  Date of discharge:                   12/21/11  Procedures this admission: SVD with a right periurethral and labial  Date of Delivery: 12/18/13 by Nigel Bridgeman  Newborn Data:  Live born female  Birth Weight: 4 lb 13.8 oz (2205 g) APGAR: 9, 9  Home with mother. Name: "Aliyah" Circumcision Plan: n/a  History of Present Illness:  [name] is a 17 y.o. female, G1P1001, who presents at [redacted]w[redacted]d weeks gestation. The patient has been followed at the [facility] and Gynecology division of [facility]. She was admitted onset of labor. Her pregnancy has been complicated by:   Patient Active Problem List   Diagnosis Date Noted  . Normal labor and delivery 12/18/2013  . Vaginal delivery 12/18/2013  . Smoker 12/06/2013  . Migraine, unspecified, without mention of intractable migraine without mention of status migrainosus 12/06/2013  . Hx of pyelonephritis--2013 12/06/2013  . Hearing loss in right ear 12/06/2013  . Asthma, chronic--exercise induced 12/06/2013  . RBC abnormality--small RBCs, dx at [facility], told to take Fe. 12/06/2013  . Fibromyalgia/chronic fatigue 12/06/2013  . Allergy history, drug--prednisone 12/06/2013  . ABDOMINAL PAIN RIGHT LOWER QUADRANT 01/20/2008  . CONSTIPATION, HX OF 01/20/2008     Hospital course:  The patient was admitted for labor.   Her labor was not complicated. She proceeded to have a vaginal delivery of a healthy infant. Her delivery was not complicated. Her postpartum course was not complicated. Asymptomatic anemia.  She was discharged to home on postpartum day 2 doing well.  Feeding:  breast and bottle  Contraception:  Undecided - Contraceptive options given with discharge instructions  Discharge hemoglobin:  Hemoglobin  Date Value Range  Status  12/19/2013 8.7* 12.0 - 15.0 g/dL Final     HCT  Date Value Range Status  12/19/2013 26.8* 36.0 - 46.0 % Final    Discharge Physical Exam:   General: cooperative, fatigued and mild distress - pt had been up with a "fussy" baby all night Lochia: appropriate Uterine Fundus: firm Incision: healing well DVT Evaluation: No evidence of DVT seen on physical exam. Negative Homan's sign.  Intrapartum Procedures: spontaneous vaginal delivery Postpartum Procedures: none Complications-Operative and Postpartum: right periurethral and labial laceration  Discharge Diagnoses: Term Pregnancy-delivered  Discharge Information:  Activity:           pelvic rest Diet:                routine Medications: PNV, Ibuprofen and Iron Condition:      stable Instructions:   Postpartum Care After Vaginal Delivery  After you deliver your newborn (postpartum period), the usual stay in the hospital is 24 72 hours. If there were problems with your labor or delivery, or if you have other medical problems, you might be in the hospital longer.  While you are in the hospital, you will receive help and instructions on how to care for yourself and your newborn during the postpartum period.  While you are in the hospital:  Be sure to tell your nurses if you have pain or discomfort, as well as where you feel the pain and what makes the pain  worse.  If you had an incision made near your vagina (episiotomy) or if you had some tearing during delivery, the nurses may put ice packs on your episiotomy or tear. The ice packs may help to reduce the pain and swelling.  If you are breastfeeding, you may feel uncomfortable contractions of your uterus for a couple of weeks. This is normal. The contractions help your uterus get back to normal size.  It is normal to have some bleeding after delivery.  For the first 1 3 days after delivery, the flow is red and the amount may be similar to a period.  It is common for the  flow to start and stop.  In the first few days, you may pass some small clots. Let your nurses know if you begin to pass large clots or your flow increases.  Do not  flush blood clots down the toilet before having the nurse look at them.  During the next 3 10 days after delivery, your flow should become more watery and pink or brown-tinged in color.  Ten to fourteen days after delivery, your flow should be a small amount of yellowish-white discharge.  The amount of your flow will decrease over the first few weeks after delivery. Your flow may stop in 6 8 weeks. Most women have had their flow stop by 12 weeks after delivery.  You should change your sanitary pads frequently.  Wash your hands thoroughly with soap and water for at least 20 seconds after changing pads, using the toilet, or before holding or feeding your newborn.  You should feel like you need to empty your bladder within the first 6 8 hours after delivery.  In case you become weak, lightheaded, or faint, call your nurse before you get out of bed for the first time and before you take a shower for the first time.  Within the first few days after delivery, your breasts may begin to feel tender and full. This is called engorgement. Breast tenderness usually goes away within 48 72 hours after engorgement occurs. You may also notice milk leaking from your breasts. If you are not breastfeeding, do not stimulate your breasts. Breast stimulation can make your breasts produce more milk.  Spending as much time as possible with your newborn is very important. During this time, you and your newborn can feel close and get to know each other. Having your newborn stay in your room (rooming in) will help to strengthen the bond with your newborn. It will give you time to get to know your newborn and become comfortable caring for your newborn.  Your hormones change after delivery. Sometimes the hormone changes can temporarily cause you to feel sad  or tearful. These feelings should not last more than a few days. If these feelings last longer than that, you should talk to your caregiver.  If desired, talk to your caregiver about methods of family planning or contraception.  Talk to your caregiver about immunizations. Your caregiver may want you to have the following immunizations before leaving the hospital:  Tetanus, diphtheria, and pertussis (Tdap) or tetanus and diphtheria (Td) immunization. It is very important that you and your family (including grandparents) or others caring for your newborn are up-to-date with the Tdap or Td immunizations. The Tdap or Td immunization can help protect your newborn from getting ill.  Rubella immunization.  Varicella (chickenpox) immunization.  Influenza immunization. You should receive this annual immunization if you did not receive the immunization during your pregnancy.  Document Released: 10/07/2007 Document Revised: 09/03/2012 Document Reviewed: 08/06/2012 [facility] Patient Information 2014 [place], [place].   Postpartum Depression and Baby Blues  The postpartum period begins right after the birth of a baby. During this time, there is often a great amount of joy and excitement. It is also a time of considerable changes in the life of the parent(s). Regardless of how many times a mother gives birth, each child brings new challenges and dynamics to the family. It is not unusual to have feelings of excitement accompanied by confusing shifts in moods, emotions, and thoughts. All mothers are at risk of developing postpartum depression or the "baby blues." These mood changes can occur right after giving birth, or they may occur many months after giving birth. The baby blues or postpartum depression can be mild or severe. Additionally, postpartum depression can resolve rather quickly, or it can be a long-term condition. CAUSES Elevated hormones and their rapid decline are thought to be a main cause of  postpartum depression and the baby blues. There are a number of hormones that radically change during and after pregnancy. Estrogen and progesterone usually decrease immediately after delivering your baby. The level of thyroid hormone and various cortisol steroids also rapidly drop. Other factors that play a major role in these changes include major life events and genetics.  RISK FACTORS If you have any of the following risks for the baby blues or postpartum depression, know what symptoms to watch out for during the postpartum period. Risk factors that may increase the likelihood of getting the baby blues or postpartum depression include:  Havinga personal or family history of depression.  Having depression while being pregnant.  Having premenstrual or oral contraceptive-associated mood issues.  Having exceptional life stress.  Having marital conflict.  Lacking a social support network.  Having a baby with special needs.  Having health problems such as diabetes. SYMPTOMS Baby blues symptoms include:  Brief fluctuations in mood, such as going from extreme happiness to sadness.  Decreased concentration.  Difficulty sleeping.  Crying spells, tearfulness.  Irritability.  Anxiety. Postpartum depression symptoms typically begin within the first month after giving birth. These symptoms include:  Difficulty sleeping or excessive sleepiness.  Marked weight loss.  Agitation.  Feelings of worthlessness.  Lack of interest in activity or food. Postpartum psychosis is a very concerning condition and can be dangerous. Fortunately, it is rare. Displaying any of the following symptoms is cause for immediate medical attention. Postpartum psychosis symptoms include:  Hallucinations and delusions.  Bizarre or disorganized behavior.  Confusion or disorientation. DIAGNOSIS  A diagnosis is made by an evaluation of your symptoms. There are no medical or lab tests that lead to a  diagnosis, but there are various questionnaires that a caregiver may use to identify those with the baby blues, postpartum depression, or psychosis. Often times, a screening tool called the [place] Postnatal Depression Scale is used to diagnose depression in the postpartum period.  TREATMENT The baby blues usually goes away on its own in 1 to 2 weeks. Social support is often all that is needed. You should be encouraged to get adequate sleep and rest. Occasionally, you may be given medicines to help you sleep.  Postpartum depression requires treatment as it can last several months or longer if it is not treated. Treatment may include individual or group therapy, medicine, or both to address any social, physiological, and psychological factors that may play a role in the depression. Regular exercise, a healthy diet, rest, and social  support may also be strongly recommended.  Postpartum psychosis is more serious and needs treatment right away. Hospitalization is often needed. HOME CARE INSTRUCTIONS  Get as much rest as you can. Nap when the baby sleeps.  Exercise regularly. Some women find yoga and walking to be beneficial.  Eat a balanced and nourishing diet.  Do little things that you enjoy. Have a cup of tea, take a bubble bath, read your favorite magazine, or listen to your favorite music.  Avoid alcohol.  Ask for help with household chores, cooking, grocery shopping, or running errands as needed. Do not try to do everything.  Talk to people close to you about how you are feeling. Get support from your partner, family members, friends, or other new moms.  Try to stay positive in how you think. Think about the things you are grateful for.  Do not spend a lot of time alone.  Only take medicine as directed by your caregiver.  Keep all your postpartum appointments.  Let your caregiver know if you have any concerns. SEEK MEDICAL CARE IF: You are having a reaction or problems with your  medicine. SEEK IMMEDIATE MEDICAL CARE IF:  You have suicidal feelings.  You feel you may harm the baby or someone else. Document Released: 09/13/2004 Document Revised: 03/03/2012 Document Reviewed: 10/16/2011 [facility] Patient Information 2014 [place], [place].   Discharge to: home  Follow-up Information   Follow up with [facility]. Schedule an appointment as soon as possible for a visit in 5 weeks. (Call with any questions or concerns)    Specialty:  Obstetrics and Gynecology   Contact information:   [address] [place] [address] [phone]       Haroldine Laws 12/20/2013

## 2013-12-20 NOTE — Lactation Note (Signed)
This note was copied from the chart of [name]. Lactation Consultation Note: Follow up visit with mom. She reports that she is going to pump and bottle feed EBM. Offered assist with latch but mom refused. [employer] rental completed. Mom plans to apply for [facility] but wants pump rental from [place] until she can get one. Encouraged pumping q 3 hours to promote milk supply. Mom reports that she has been pumping q 3 hours but did not obtain any Colostrum at the last few pumpings. Encouragement given. No questions at present. To call prn  Patient Name: [name] ONGEX'B Date: 12/20/2013 Reason for consult: Follow-up assessment   Maternal Data    Feeding    LATCH Score/Interventions                      Lactation Tools Discussed/Used     Consult Status Consult Status: Complete    Pamelia Hoit 12/20/2013, 11:18 AM

## 2013-12-20 NOTE — Progress Notes (Signed)
Post Partum Day 1:S/P SVB, 1st degree labia minora and periurethral laceration  Subjective: Patient denies HA, visual changes, or right epigastric pain  Objective: Filed Vitals:   12/19/13 1035 12/19/13 1800 12/20/13 0552 12/20/13 0554  BP: 131/87 124/81 136/90 133/93  Pulse: 99 81 75   Temp:  97.9 F (36.6 C) 97.7 F (36.5 C)   TempSrc:  Oral Oral   Resp: 18 18 18    SpO2:   99%       Assessment/Plan: Elevated BP  C/w Dr. Dion Body  VS Q 4 hour [facility] labs  D/c pending [facility] labs   LOS: 2 days   [name] 12/20/2013, 9:29 AM

## 2013-12-21 NOTE — Progress Notes (Signed)
Post discharge chart review completed.  

## 2014-01-20 ENCOUNTER — Encounter (HOSPITAL_COMMUNITY)
Admission: RE | Admit: 2014-01-20 | Discharge: 2014-01-20 | Disposition: A | Discharge status: Home / Self Care | Payer: Medicaid Other | Source: Ambulatory Visit | Attending: Obstetrics and Gynecology | Admitting: Obstetrics and Gynecology

## 2014-01-20 DIAGNOSIS — O923 Agalactia: Secondary | ICD-10-CM | POA: Insufficient documentation

## 2014-02-12 ENCOUNTER — Emergency Department (HOSPITAL_COMMUNITY)
Admission: EM | Admit: 2014-02-12 | Discharge: 2014-02-12 | Disposition: A | Discharge status: Home / Self Care | Payer: Medicaid Other | Attending: Emergency Medicine | Admitting: Emergency Medicine

## 2014-02-12 ENCOUNTER — Encounter (HOSPITAL_COMMUNITY): Payer: Self-pay | Admitting: Emergency Medicine

## 2014-02-12 DIAGNOSIS — R5381 Other malaise: Secondary | ICD-10-CM | POA: Insufficient documentation

## 2014-02-12 DIAGNOSIS — F17200 Nicotine dependence, unspecified, uncomplicated: Secondary | ICD-10-CM | POA: Insufficient documentation

## 2014-02-12 DIAGNOSIS — J45909 Unspecified asthma, uncomplicated: Secondary | ICD-10-CM | POA: Insufficient documentation

## 2014-02-12 DIAGNOSIS — Z79899 Other long term (current) drug therapy: Secondary | ICD-10-CM | POA: Insufficient documentation

## 2014-02-12 DIAGNOSIS — R5383 Other fatigue: Secondary | ICD-10-CM

## 2014-02-12 DIAGNOSIS — R0982 Postnasal drip: Secondary | ICD-10-CM | POA: Insufficient documentation

## 2014-02-12 DIAGNOSIS — R05 Cough: Secondary | ICD-10-CM | POA: Insufficient documentation

## 2014-02-12 DIAGNOSIS — J069 Acute upper respiratory infection, unspecified: Secondary | ICD-10-CM | POA: Insufficient documentation

## 2014-02-12 DIAGNOSIS — R059 Cough, unspecified: Secondary | ICD-10-CM | POA: Insufficient documentation

## 2014-02-12 DIAGNOSIS — IMO0001 Reserved for inherently not codable concepts without codable children: Secondary | ICD-10-CM | POA: Insufficient documentation

## 2014-02-12 LAB — RAPID STREP SCREEN (MED CTR MEBANE ONLY): Streptococcus, Group A Screen (Direct): NEGATIVE

## 2014-02-12 NOTE — Discharge Instructions (Signed)
Please read and follow all provided instructions.  Your diagnoses today include:  1. Viral URI    You appear to have an upper respiratory infection (URI). An upper respiratory tract infection, or cold, is a viral infection of the air passages leading to the lungs. It should improve gradually after 5-7 days. You may have a lingering cough that lasts for 2- 4 weeks after the infection.  Tests performed today include:  Vital signs. See below for your results today.   Medications prescribed:   Naproxen - anti-inflammatory pain medication  Do not exceed 500mg  naproxen every 12 hours, take with food  You have been prescribed an anti-inflammatory medication or NSAID. Take with food. Take smallest effective dose for the shortest duration needed for your pain. Stop taking if you experience stomach pain or vomiting.   Take any prescribed medications only as directed. Treatment for your infection is aimed at treating the symptoms. There are no medications, such as antibiotics, that will cure your infection.   Home care instructions:  Follow any educational materials contained in this packet.   Your illness is contagious and can be spread to others, especially during the first 3 or 4 days. It cannot be cured by antibiotics or other medicines. Take basic precautions such as washing your hands often, covering your mouth when you cough or sneeze, and avoiding public places where you could spread your illness to others.   Please continue drinking plenty of fluids.  Use over-the-counter medicines as needed as directed on packaging for symptom relief.  You may also use ibuprofen or tylenol as directed on packaging for pain or fever.  Do not take multiple medicines containing Tylenol or acetaminophen to avoid taking too much of this medication.  Follow-up instructions: Please follow-up with your primary care provider in the next 3 days for further evaluation of your symptoms if you are not feeling better.  If you do not have a primary care doctor -- see below for referral information.   Return instructions:   Please return to the Emergency Department if you experience worsening symptoms.   RETURN IMMEDIATELY IF you develop shortness of breath, confusion or altered mental status, a new rash, become dizzy, faint, or poorly responsive, or are unable to be cared for at home.  Please return if you have persistent vomiting and cannot keep down fluids or develop a fever that is not controlled by tylenol or motrin.    Please return if you have any other emergent concerns.  Additional Information:  Your vital signs today were: BP 120/69   Pulse 62   Temp(Src) 97.9 F (36.6 C) (Oral)   Resp 18   SpO2 99% If your blood pressure (BP) was elevated above 135/85 this visit, please have this repeated by your doctor within one month. --------------

## 2014-02-12 NOTE — ED Notes (Signed)
Pt c/o URI sx with cough and sore throat x 3 days; pt unsure of fever

## 2014-02-12 NOTE — ED Provider Notes (Signed)
CSN: [id number]     Arrival date & time 02/12/14  4098 History  This chart was scribed for non-physician practitioner, Renne Crigler, PA-C, working with Shelda Jakes, MD by Shari Heritage, ED Scribe. This patient was seen in room TR06C/TR06C and the patient's care was started at 9:46 AM.    Chief Complaint  Patient presents with  . Sore Throat  . URI     The history is provided by the patient. No language interpreter was used.    HPI Comments: [name] is a 18 y.o. female who presents to the Emergency Department complaining of constant moderate sore throat onset 1 week ago. There is associated postnasal drip that began 1 week ago. She began having congestion last night. Patient further reports that she has had an occasional cough with increased throat irritation. She has also noticed swollen lymph nodes in her neck. She took Dayquil last night, but she hadn't tried any other medicines prior to this. She denies fever, ear pain, facial pain, nausea, vomiting or diarrhea. Patient does not smoke or drink alcohol.    Past Medical History  Diagnosis Date  . Fibromyalgia   . Chronic fatigue   . Asthma     exercise induced   Past Surgical History  Procedure Laterality Date  . No past surgeries     No family history on file. History  Substance Use Topics  . Smoking status: Current Every Day Smoker -- 0.25 packs/day    Types: Cigarettes  . Smokeless tobacco: Not on file  . Alcohol Use: No   OB History   Grav Para Term Preterm Abortions TAB SAB Ect Mult Living   1 1 1       1      Review of Systems  Constitutional: Negative for fever.  HENT: Positive for postnasal drip and sore throat. Negative for ear pain.   Respiratory: Positive for cough (occasionally with throat irritation).   Gastrointestinal: Negative for nausea, vomiting and diarrhea.     Allergies  Other; Prednisone; and Vicodin  Home Medications   Current Outpatient Rx  Name  Route  Sig  Dispense   Refill  . ferrous sulfate 325 (65 FE) MG tablet   Oral   Take 1 tablet (325 mg total) by mouth 2 (two) times daily with a meal.   60 tablet   3   . ibuprofen (ADVIL,MOTRIN) 600 MG tablet   Oral   Take 1 tablet (600 mg total) by mouth every 6 (six) hours.   30 tablet   0   . Prenatal Vit-Fe Fumarate-FA (PRENATAL MULTIVITAMIN) TABS tablet   Oral   Take 1 tablet by mouth daily at 12 noon.          Triage Vitals: BP 120/69  Pulse 62  Temp(Src) 97.9 F (36.6 C) (Oral)  Resp 18  SpO2 99% Physical Exam  Nursing note and vitals reviewed. Constitutional: She appears well-developed and well-nourished. No distress.  HENT:  Head: Normocephalic and atraumatic.  Right Ear: Tympanic membrane, external ear and ear canal normal.  Left Ear: Tympanic membrane, external ear and ear canal normal.  Mouth/Throat: Mucous membranes are normal. Posterior oropharyngeal erythema (mild) present. No oropharyngeal exudate or posterior oropharyngeal edema.  Eyes: EOM are normal.  Neck: Neck supple. No tracheal deviation present.  Cardiovascular: Normal rate, regular rhythm and normal heart sounds.  Exam reveals no gallop and no friction rub.   No murmur heard. Pulmonary/Chest: Effort normal and breath sounds normal. No respiratory  distress. She has no wheezes. She has no rales.  Musculoskeletal: Normal range of motion.  Lymphadenopathy:    She has no cervical adenopathy.  Neurological: She is alert.  Skin: Skin is warm and dry.  Psychiatric: She has a normal mood and affect. Her behavior is normal.    ED Course  Procedures (including critical care time) DIAGNOSTIC STUDIES: Oxygen Saturation is 99% on room air, normal by my interpretation.    COORDINATION OF CARE: 9:46 AM- Patient informed of current plan for treatment and evaluation and agrees with plan at this time.     Labs Review Labs Reviewed - No data to display Imaging Review No results found.  EKG Interpretation   None       Vital signs reviewed and are as follows: Filed Vitals:   02/12/14 1058  BP: 105/88  Pulse: 75  Temp:   Resp: 18   10:59 AM Pt informed of neg strep result. Patient counseled on supportive care for viral URI and s/s to return including worsening symptoms, persistent fever, persistent vomiting, or if they have any other concerns. Urged to see PCP if symptoms persist for more than 3 days. Patient verbalizes understanding and agrees with plan.    MDM   Final diagnoses:  Viral URI    Patient with symptoms consistent with a viral syndrome. Strep neg. Vitals are stable, no fever. No signs of dehydration. Lung exam normal, no signs of pneumonia. Supportive therapy indicated with return if symptoms worsen.     I personally performed the services described in this documentation, which was scribed in my presence. The recorded information has been reviewed and is accurate.   Renne CriglerJoshua Saurav Crumble, PA-C 02/12/14 1059

## 2014-02-14 LAB — CULTURE, GROUP A STREP

## 2014-02-16 NOTE — ED Provider Notes (Signed)
Medical screening examination/treatment/procedure(s) were performed by non-physician practitioner and as supervising physician I was immediately available for consultation/collaboration.  EKG Interpretation   None         Elsworth Ledin W. Karalyne Nusser, MD 02/16/14 0916 

## 2014-02-20 ENCOUNTER — Encounter (HOSPITAL_COMMUNITY)
Admission: RE | Admit: 2014-02-20 | Discharge: 2014-02-20 | Disposition: A | Discharge status: Home / Self Care | Payer: Medicaid Other | Source: Ambulatory Visit | Attending: Obstetrics and Gynecology | Admitting: Obstetrics and Gynecology

## 2014-02-20 DIAGNOSIS — O923 Agalactia: Secondary | ICD-10-CM | POA: Insufficient documentation

## 2014-03-05 ENCOUNTER — Encounter (HOSPITAL_COMMUNITY): Payer: Self-pay | Admitting: Emergency Medicine

## 2014-03-05 ENCOUNTER — Emergency Department (HOSPITAL_COMMUNITY): Payer: Medicaid Other

## 2014-03-05 ENCOUNTER — Emergency Department (HOSPITAL_COMMUNITY)
Admission: EM | Admit: 2014-03-05 | Discharge: 2014-03-05 | Disposition: A | Discharge status: Home / Self Care | Payer: Medicaid Other | Attending: Emergency Medicine | Admitting: Emergency Medicine

## 2014-03-05 DIAGNOSIS — J45909 Unspecified asthma, uncomplicated: Secondary | ICD-10-CM | POA: Insufficient documentation

## 2014-03-05 DIAGNOSIS — Y990 Civilian activity done for income or pay: Secondary | ICD-10-CM | POA: Insufficient documentation

## 2014-03-05 DIAGNOSIS — Y9389 Activity, other specified: Secondary | ICD-10-CM | POA: Insufficient documentation

## 2014-03-05 DIAGNOSIS — Y9289 Other specified places as the place of occurrence of the external cause: Secondary | ICD-10-CM | POA: Insufficient documentation

## 2014-03-05 DIAGNOSIS — W010XXA Fall on same level from slipping, tripping and stumbling without subsequent striking against object, initial encounter: Secondary | ICD-10-CM | POA: Insufficient documentation

## 2014-03-05 DIAGNOSIS — S4980XA Other specified injuries of shoulder and upper arm, unspecified arm, initial encounter: Secondary | ICD-10-CM | POA: Insufficient documentation

## 2014-03-05 DIAGNOSIS — F17200 Nicotine dependence, unspecified, uncomplicated: Secondary | ICD-10-CM | POA: Insufficient documentation

## 2014-03-05 DIAGNOSIS — S46909A Unspecified injury of unspecified muscle, fascia and tendon at shoulder and upper arm level, unspecified arm, initial encounter: Secondary | ICD-10-CM | POA: Insufficient documentation

## 2014-03-05 DIAGNOSIS — M25519 Pain in unspecified shoulder: Secondary | ICD-10-CM

## 2014-03-05 NOTE — Discharge Instructions (Signed)
Acromioclavicular Injuries °The acromioclavicular (AC) joint is the joint in the shoulder. There are many bands of tissue (ligaments) that surround the AC bones and joints. These bands of tissue can tear, which can lead to sprains and separations. The bones of the AC joint can also break (fracture).  °HOME CARE  °· Put ice on the injured area. °· Put ice in a plastic bag. °· Place a towel between your skin and the bag. °· Leave the ice on for 15-20 minutes, 03-04 times a day. °· Wear your sling as told by your doctor. Remove the sling before showering and bathing. Keep the shoulder in the same place as when the sling is on. Do not lift the arm. °· Gently tighten your figure-eight splint (if applied) every day. Tighten it enough to keep the shoulders held back. There should be room to place your finger between your body and the strap. Loosen the splint right away if you lose feeling (numbness) or have tingling in your hands. °· Only take medicine as told by your doctor. °· Keep all follow-up visits with your doctor. °GET HELP RIGHT AWAY IF:  °· Your medicine does not help your pain. °· You have more puffiness (swelling) or your bruising gets worse rather than better. °· You were unable to follow up as told by your doctor. °· You have tingling or lose even more feeling in your arm, forearm, or hand. °· Your arm is cold or pale. °· You have more pain in the hand, forearm, or fingers. °MAKE SURE YOU:  °· Understand these instructions. °· Will watch your condition. °· Will get help right away if you are not doing well or get worse. °Document Released: 05/30/2010 Document Revised: 03/03/2012 Document Reviewed: 05/30/2010 °[employer]® Patient Information ©2014 [employer]. ° °

## 2014-03-05 NOTE — ED Provider Notes (Signed)
Medical screening examination/treatment/procedure(s) were performed by non-physician practitioner and as supervising physician I was immediately available for consultation/collaboration.   EKG Interpretation None        Dagmar HaitWilliam Jediah Horger, MD 03/05/14 Rickey [id number]

## 2014-03-05 NOTE — ED Provider Notes (Signed)
CSN: [id number]     Arrival date & time 03/05/14  1639 History  This chart was scribed for non-physician practitioner Roxy Horseman working with Dagmar Hait, MD by Joaquin Music, ED Scribe. This patient was seen in room TR05C/TR05C and the patient's care was started at 5:11 PM .  Chief Complaint  Patient presents with  . Shoulder Injury   The history is provided by the patient. No language interpreter was used.   HPI Comments: [name] is a 18 y.o. female who presents to the Emergency Department complaining of L shoulder injury that occurred 4 hours ago while at work. Pt states she tripped over trash bag and reports falling, shoulder first,into a metal shelf. She states with pressure, she has more pain. Pt states she does not suspect she has a fracture or broke "anything" She does not suspect she has a fracture or broken shoulder. Pt denies hearing a "pop" when she fell. Pt states she has not been able to lift her arm due to pain. Pt denies any other injuries.   Past Medical History  Diagnosis Date  . Fibromyalgia   . Chronic fatigue   . Asthma     exercise induced   Past Surgical History  Procedure Laterality Date  . No past surgeries     History reviewed. No pertinent family history. History  Substance Use Topics  . Smoking status: Current Every Day Smoker -- 0.25 packs/day    Types: Cigarettes  . Smokeless tobacco: Not on file  . Alcohol Use: No   OB History   Grav Para Term Preterm Abortions TAB SAB Ect Mult Living   1 1 1       1      Review of Systems  All other systems reviewed and are negative.    Allergies  Other; Prednisone; and Vicodin  Home Medications   Current Outpatient Rx  Name  Route  Sig  Dispense  Refill  . naproxen sodium (ANAPROX) 220 MG tablet   Oral   Take 440 mg by mouth daily as needed (fibromyalgia pain).          BP 113/71  Pulse 77  Temp(Src) 98.1 F (36.7 C) (Oral)  Resp 18  Wt 201 lb 3.2 oz  (91.264 kg)  SpO2 100%  Physical Exam  Nursing note and vitals reviewed. Constitutional: She is oriented to person, place, and time. She appears well-developed and well-nourished. No distress.  HENT:  Head: Normocephalic and atraumatic.  Eyes: EOM are normal.  Neck: Neck supple. No tracheal deviation present.  Cardiovascular: Normal rate, regular rhythm, normal heart sounds and intact distal pulses.   Brisk capillary refills.  Pulmonary/Chest: Effort normal. No respiratory distress.  Musculoskeletal: Normal range of motion.  ROM reduced secondary to pain. Tender to palpation over posterior aspect. No bony abnormality or deformities.  Neurological: She is alert and oriented to person, place, and time.  Normal sensation  Skin: Skin is warm and dry.  Psychiatric: She has a normal mood and affect. Her behavior is normal.   ED Course  Procedures  DIAGNOSTIC STUDIES: Oxygen Saturation is 100% on RA, normal by my interpretation.    COORDINATION OF CARE: 5:13 PM-Discussed treatment plan which includes L shoulder X-ray. Pt agreed to plan.   Labs Review Labs Reviewed - No data to display Imaging Review No results found.   EKG Interpretation None      MDM   Final diagnoses:  Shoulder pain    Patient with left  shoulder injury following a mechanical fall. Plain films are negative. Will sling, and discharged to home with orthopedic followup. Concern for rotator cuff or other acromioclavicular joint injury.  I personally performed the services described in this documentation, which was scribed in my presence. The recorded information has been reviewed and is accurate.     Roxy Horsemanobert Jesyka Slaght, PA-C 03/05/14 [phone]

## 2014-03-05 NOTE — ED Notes (Signed)
States she hurt her L shoulder at work today. She tripped over a bag and hit a metal shelf with her L shoulder. Hurts to touch. Cms intact

## 2014-03-22 ENCOUNTER — Encounter (HOSPITAL_COMMUNITY)
Admission: RE | Admit: 2014-03-22 | Discharge: 2014-03-22 | Disposition: A | Discharge status: Home / Self Care | Payer: Medicaid Other | Source: Ambulatory Visit | Attending: Obstetrics and Gynecology | Admitting: Obstetrics and Gynecology

## 2014-03-22 DIAGNOSIS — O923 Agalactia: Secondary | ICD-10-CM | POA: Insufficient documentation

## 2014-04-22 ENCOUNTER — Encounter (HOSPITAL_COMMUNITY)
Admission: RE | Admit: 2014-04-22 | Discharge: 2014-04-22 | Disposition: A | Discharge status: Home / Self Care | Payer: Medicaid Other | Source: Ambulatory Visit | Attending: Obstetrics and Gynecology | Admitting: Obstetrics and Gynecology

## 2014-04-22 DIAGNOSIS — O923 Agalactia: Secondary | ICD-10-CM | POA: Insufficient documentation

## 2014-05-22 ENCOUNTER — Encounter (HOSPITAL_COMMUNITY)
Admission: RE | Admit: 2014-05-22 | Discharge: 2014-05-22 | Disposition: A | Discharge status: Home / Self Care | Payer: Medicaid Other | Source: Ambulatory Visit | Attending: Obstetrics and Gynecology | Admitting: Obstetrics and Gynecology

## 2014-05-22 DIAGNOSIS — O923 Agalactia: Secondary | ICD-10-CM | POA: Insufficient documentation

## 2014-06-07 ENCOUNTER — Encounter (HOSPITAL_COMMUNITY): Payer: Self-pay | Admitting: *Deleted

## 2014-06-07 ENCOUNTER — Inpatient Hospital Stay (HOSPITAL_COMMUNITY)
Admission: AD | Admit: 2014-06-07 | Discharge: 2014-06-07 | Disposition: A | Discharge status: Home / Self Care | Payer: Medicaid Other | Source: Ambulatory Visit | Attending: Obstetrics & Gynecology | Admitting: Obstetrics & Gynecology

## 2014-06-07 ENCOUNTER — Inpatient Hospital Stay (HOSPITAL_COMMUNITY): Payer: Medicaid Other

## 2014-06-07 DIAGNOSIS — O99330 Smoking (tobacco) complicating pregnancy, unspecified trimester: Secondary | ICD-10-CM | POA: Insufficient documentation

## 2014-06-07 DIAGNOSIS — R109 Unspecified abdominal pain: Secondary | ICD-10-CM | POA: Insufficient documentation

## 2014-06-07 DIAGNOSIS — O9989 Other specified diseases and conditions complicating pregnancy, childbirth and the puerperium: Secondary | ICD-10-CM

## 2014-06-07 DIAGNOSIS — O26899 Other specified pregnancy related conditions, unspecified trimester: Secondary | ICD-10-CM

## 2014-06-07 DIAGNOSIS — O99891 Other specified diseases and conditions complicating pregnancy: Secondary | ICD-10-CM | POA: Insufficient documentation

## 2014-06-07 LAB — HCG, QUANTITATIVE, PREGNANCY: hCG, Beta Chain, Quant, S: 344 m[IU]/mL — ABNORMAL HIGH (ref <5)

## 2014-06-07 LAB — CBC
HEMATOCRIT: 35 % — AB (ref 36.0–46.0)
HEMOGLOBIN: 11.4 g/dL — AB (ref 12.0–15.0)
MCH: 25.4 pg — ABNORMAL LOW (ref 26.0–34.0)
MCHC: 32.6 g/dL (ref 30.0–36.0)
MCV: 78.1 fL (ref 78.0–100.0)
Platelets: 238 10*3/uL (ref 150–400)
RBC: 4.48 MIL/uL (ref 3.87–5.11)
RDW: 16.4 % — ABNORMAL HIGH (ref 11.5–15.5)
WBC: 9.1 10*3/uL (ref 4.0–10.5)

## 2014-06-07 LAB — WET PREP, GENITAL
CLUE CELLS WET PREP: NONE SEEN
Trich, Wet Prep: NONE SEEN
Yeast Wet Prep HPF POC: NONE SEEN

## 2014-06-07 LAB — URINALYSIS, ROUTINE W REFLEX MICROSCOPIC
Bilirubin Urine: NEGATIVE
GLUCOSE, UA: NEGATIVE mg/dL
Hgb urine dipstick: NEGATIVE
Ketones, ur: NEGATIVE mg/dL
LEUKOCYTES UA: NEGATIVE
Nitrite: NEGATIVE
PH: 8 (ref 5.0–8.0)
PROTEIN: NEGATIVE mg/dL
Specific Gravity, Urine: 1.015 (ref 1.005–1.030)
Urobilinogen, UA: 0.2 mg/dL (ref 0.0–1.0)

## 2014-06-07 LAB — POCT PREGNANCY, URINE: PREG TEST UR: POSITIVE — AB

## 2014-06-07 NOTE — Discharge Instructions (Signed)
Pregnancy, First Trimester °The first trimester is the first 3 months your baby is growing inside you. It is important to follow your doctor's instructions. °HOME CARE  °· Do not smoke. °· Do not drink alcohol. °· Only take medicine as told by your doctor. °· Exercise. °· Eat healthy foods. Eat regular, well-balanced meals. °· You can have sex (intercourse) if there are no other problems with the pregnancy. °· Things that help with morning sickness: °· Eat soda crackers before getting up in the morning. °· Eat 4 to 5 small meals rather than 3 large meals. °· Drink liquids between meals, not during meals. °· Go to all appointments as told. °· Take all vitamins or supplements as told by your doctor. °GET HELP RIGHT AWAY IF:  °· You develop a fever. °· You have a bad smelling fluid that is leaking from your vagina. °· There is bleeding from the vagina. °· You develop severe belly (abdominal) or back pain. °· You throw up (vomit) blood. It may look like coffee grounds. °· You lose more than 2 pounds in a week. °· You gain 5 pounds or more in a week. °· You gain more than 2 pounds in a week and you see puffiness (swelling) in your feet, ankles, or legs. °· You have severe dizziness or pass out (faint). °· You are around people who have German measles, chickenpox, or fifth disease. °· You have a headache, watery poop (diarrhea), pain with peeing (urinating), or cannot breath right. °Document Released: 05/28/2008 Document Revised: 03/03/2012 Document Reviewed: 05/28/2008 °Exit[employer]® Patient Information ©2014 [employer]. ° °

## 2014-06-07 NOTE — MAU Provider Note (Signed)
History     CSN: [id number]  Arrival date and time: 06/07/14 1512   First Provider Initiated Contact with Patient 06/07/14 1532      Chief Complaint  Patient presents with  . Abdominal Cramping  . Possible Pregnancy   HPI [name] is a 18 y.o. G2P1001 at 2434w3d who presents to MAU today with complaint of lower abdominal pain. She states pain is just below the umbilicus in the LLQ. She rates pain at 7/10 now. She denies taking any pain medication. She denies vaginal bleeding, discharge, UTI symptoms, N/V/D or constipation or fever.   OB History   Grav Para Term Preterm Abortions TAB SAB Ect Mult Living   2 1 1       1       Past Medical History  Diagnosis Date  . Fibromyalgia   . Chronic fatigue   . Asthma     exercise induced    Past Surgical History  Procedure Laterality Date  . No past surgeries      History reviewed. No pertinent family history.  History  Substance Use Topics  . Smoking status: Current Every Day Smoker -- 0.25 packs/day    Types: Cigarettes  . Smokeless tobacco: Not on file  . Alcohol Use: No    Allergies:  Allergies  Allergen Reactions  . Other Nausea And Vomiting and Other (See Comments)    Blue Cheese causes sweating nausea and vomitting  . Prednisone Other (See Comments)    Severe Depression  . Vicodin [Hydrocodone-Acetaminophen] Nausea And Vomiting    No prescriptions prior to admission    Review of Systems  Constitutional: Negative for fever.  Gastrointestinal: Positive for abdominal pain. Negative for nausea, vomiting, diarrhea and constipation.  Genitourinary: Negative for dysuria, urgency and frequency.       Neg - vaginal bleeding, discharge   Physical Exam   Blood pressure 128/82, pulse 95, temperature 98.4 F (36.9 C), temperature source Oral, resp. rate 16, height 5\' 5"  (1.651 m), weight 209 lb (94.802 kg), last menstrual period 05/07/2014, SpO2 99.00%, unknown if currently breastfeeding.  Physical Exam   Constitutional: She is oriented to person, place, and time. She appears well-developed and well-nourished. No distress.  HENT:  Head: Normocephalic and atraumatic.  Cardiovascular: Normal rate.   Respiratory: Effort normal.  GI: Soft. She exhibits no distension and no mass. There is tenderness (mild tenderness to palpation of the lower abdomen more prominent in the LLQ). There is no rebound and no guarding.  Genitourinary: Uterus is not enlarged and not tender. Cervix exhibits no motion tenderness, no discharge and no friability. Right adnexum displays no mass and no tenderness. Left adnexum displays no mass and no tenderness. No bleeding around the vagina. Vaginal discharge (scant thin, white discharge noted) found.  Neurological: She is alert and oriented to person, place, and time.  Skin: Skin is warm and dry. No erythema.  Psychiatric: She has a normal mood and affect.   Results for orders placed during the hospital encounter of 06/07/14 (from the past 24 hour(s))  URINALYSIS, ROUTINE W REFLEX MICROSCOPIC     Status: None   Collection Time    06/07/14  3:15 PM      Result Value Ref Range   Color, Urine YELLOW  YELLOW   APPearance CLEAR  CLEAR   Specific Gravity, Urine 1.015  1.005 - 1.030   pH 8.0  5.0 - 8.0   Glucose, UA NEGATIVE  NEGATIVE mg/dL   Hgb urine  dipstick NEGATIVE  NEGATIVE   Bilirubin Urine NEGATIVE  NEGATIVE   Ketones, ur NEGATIVE  NEGATIVE mg/dL   Protein, ur NEGATIVE  NEGATIVE mg/dL   Urobilinogen, UA 0.2  0.0 - 1.0 mg/dL   Nitrite NEGATIVE  NEGATIVE   Leukocytes, UA NEGATIVE  NEGATIVE  POCT PREGNANCY, URINE     Status: Abnormal   Collection Time    06/07/14  3:34 PM      Result Value Ref Range   Preg Test, Ur POSITIVE (*) NEGATIVE  WET PREP, GENITAL     Status: Abnormal   Collection Time    06/07/14  3:44 PM      Result Value Ref Range   Yeast Wet Prep HPF POC NONE SEEN  NONE SEEN   Trich, Wet Prep NONE SEEN  NONE SEEN   Clue Cells Wet Prep HPF POC NONE  SEEN  NONE SEEN   WBC, Wet Prep HPF POC FEW (*) NONE SEEN  HCG, QUANTITATIVE, PREGNANCY     Status: Abnormal   Collection Time    06/07/14  3:50 PM      Result Value Ref Range   hCG, Beta Chain, Quant, S 344 (*) <5 mIU/mL  CBC     Status: Abnormal   Collection Time    06/07/14  3:55 PM      Result Value Ref Range   WBC 9.1  4.0 - 10.5 K/uL   RBC 4.48  3.87 - 5.11 MIL/uL   Hemoglobin 11.4 (*) 12.0 - 15.0 g/dL   HCT 16.1 (*) 09.6 - 04.5 %   MCV 78.1  78.0 - 100.0 fL   MCH 25.4 (*) 26.0 - 34.0 pg   MCHC 32.6  30.0 - 36.0 g/dL   RDW 40.9 (*) 81.1 - 91.4 %   Platelets 238  150 - 400 K/uL   US Ob Comp Less 14 Wks  06/07/2014   CLINICAL DATA:  18 year old pregnant female abdominal and pelvic pain. Estimated gestational age of [redacted] weeks 3 days by LMP. Beta HCG of 344  EXAM: OBSTETRIC <14 WK [place] AND TRANSVAGINAL OB US  TECHNIQUE: Both transabdominal and transvaginal ultrasound examinations were performed for complete evaluation of the gestation as well as the maternal uterus, adnexal regions, and pelvic cul-de-sac. Transvaginal technique was performed to assess early pregnancy.  COMPARISON:  None.  FINDINGS: Intrauterine gestational sac: Not visualized  Yolk sac:  Not visualized  Embryo:  Not visualized  Maternal uterus/adnexae: A retroverted uterus is noted.  The endometrium is upper limits of normal in thickness.  No focal uterine masses are identified.  The ovaries bilaterally are unremarkable.  A trace amount of free pelvic fluid is noted.  IMPRESSION: No evidence of intrauterine pregnancy. Given estimated gestational age and beta HCG, this may represent normal early pregnancy, however a failed first trimester pregnancy or ectopic pregnancy are not totally excluded. Recommend follow-up quantitative B-HCG levels and follow-up US to confirm and assess viability. This recommendation follows SRU consensus guidelines: Diagnostic Criteria for Nonviable Pregnancy Early in the First Trimester. Malva Limes Med  2013; 782:9562-13.   Electronically Signed   By: [name] M.D.   On: 06/07/2014 16:54   US Ob Transvaginal  06/07/2014   CLINICAL DATA:  18 year old pregnant female abdominal and pelvic pain. Estimated gestational age of [redacted] weeks 3 days by LMP. Beta HCG of 344  EXAM: OBSTETRIC <14 WK [place] AND TRANSVAGINAL OB US  TECHNIQUE: Both transabdominal and transvaginal ultrasound examinations were performed for complete evaluation  of the gestation as well as the maternal uterus, adnexal regions, and pelvic cul-de-sac. Transvaginal technique was performed to assess early pregnancy.  COMPARISON:  None.  FINDINGS: Intrauterine gestational sac: Not visualized  Yolk sac:  Not visualized  Embryo:  Not visualized  Maternal uterus/adnexae: A retroverted uterus is noted.  The endometrium is upper limits of normal in thickness.  No focal uterine masses are identified.  The ovaries bilaterally are unremarkable.  A trace amount of free pelvic fluid is noted.  IMPRESSION: No evidence of intrauterine pregnancy. Given estimated gestational age and beta HCG, this may represent normal early pregnancy, however a failed first trimester pregnancy or ectopic pregnancy are not totally excluded. Recommend follow-up quantitative B-HCG levels and follow-up US to confirm and assess viability. This recommendation follows SRU consensus guidelines: Diagnostic Criteria for Nonviable Pregnancy Early in the First Trimester. Malva Limes Engl J Med 2013; 161:0960-45; 369:1443-51.   Electronically Signed   By: Anne AbbeJeff  Hu M.D.   On: 06/07/2014 16:54    MAU Course  Procedures None  MDM +UPT UA, wet prep, GC/Chlamydia, CBC, quant hCG and US today Patient will go to [facility] for prenatal care  Assessment and Plan  A: Abdominal pain in pregnancy, antepartum  P: Discharge home Bleeding/Ectopic precautions discussed Patient to return to MAU in 48 hours for repeat quant hCG or sooner if symptoms worsen  Freddi StarrJulie N Ethier, PA-C  06/07/2014, 5:01 PM

## 2014-06-07 NOTE — MAU Note (Signed)
Patient presents to MAU with c/o mid abdominal cramping that started today; 7/10 on pain scale. Denies VB at this time. +HPT 2 days ago. LMP 5/15. Has not seen [facility] for this pregnancy yet.

## 2014-06-07 NOTE — MAU Provider Note (Signed)
Attestation of Attending Supervision of Advanced Practitioner (CNM/NP): Evaluation and management procedures were performed by the Advanced Practitioner under my supervision and collaboration.  I have reviewed the Advanced Practitioner's note and chart, and I agree with the management and plan.  HARRAWAY-SMITH, Bensyn Bornemann 5:36 PM

## 2014-06-10 LAB — GC/CHLAMYDIA PROBE AMP
CT Probe RNA: NEGATIVE
GC Probe RNA: NEGATIVE

## 2014-06-14 ENCOUNTER — Encounter (HOSPITAL_COMMUNITY): Payer: Self-pay

## 2014-06-22 ENCOUNTER — Encounter (HOSPITAL_COMMUNITY)
Admission: RE | Admit: 2014-06-22 | Discharge: 2014-06-22 | Disposition: A | Discharge status: Home / Self Care | Payer: Medicaid Other | Source: Ambulatory Visit | Attending: Obstetrics and Gynecology | Admitting: Obstetrics and Gynecology

## 2014-06-22 DIAGNOSIS — O923 Agalactia: Secondary | ICD-10-CM | POA: Insufficient documentation

## 2014-07-13 ENCOUNTER — Encounter (HOSPITAL_COMMUNITY): Payer: Self-pay | Admitting: *Deleted

## 2014-07-13 ENCOUNTER — Inpatient Hospital Stay (HOSPITAL_COMMUNITY)
Admission: AD | Admit: 2014-07-13 | Discharge: 2014-07-13 | Disposition: A | Discharge status: Home / Self Care | Payer: Medicaid Other | Source: Ambulatory Visit | Attending: Obstetrics and Gynecology | Admitting: Obstetrics and Gynecology

## 2014-07-13 DIAGNOSIS — O99891 Other specified diseases and conditions complicating pregnancy: Secondary | ICD-10-CM | POA: Diagnosis not present

## 2014-07-13 DIAGNOSIS — O9989 Other specified diseases and conditions complicating pregnancy, childbirth and the puerperium: Principal | ICD-10-CM

## 2014-07-13 DIAGNOSIS — R42 Dizziness and giddiness: Secondary | ICD-10-CM | POA: Diagnosis not present

## 2014-07-13 DIAGNOSIS — O2650 Maternal hypotension syndrome, unspecified trimester: Secondary | ICD-10-CM | POA: Diagnosis present

## 2014-07-13 DIAGNOSIS — O210 Mild hyperemesis gravidarum: Secondary | ICD-10-CM | POA: Insufficient documentation

## 2014-07-13 LAB — URINALYSIS, ROUTINE W REFLEX MICROSCOPIC
Bilirubin Urine: NEGATIVE
GLUCOSE, UA: NEGATIVE mg/dL
Hgb urine dipstick: NEGATIVE
Ketones, ur: NEGATIVE mg/dL
LEUKOCYTES UA: NEGATIVE
Nitrite: NEGATIVE
Protein, ur: NEGATIVE mg/dL
Specific Gravity, Urine: 1.015 (ref 1.005–1.030)
Urobilinogen, UA: 0.2 mg/dL (ref 0.0–1.0)
pH: 6.5 (ref 5.0–8.0)

## 2014-07-13 LAB — CBC
HEMATOCRIT: 33.8 % — AB (ref 36.0–46.0)
HEMOGLOBIN: 11.1 g/dL — AB (ref 12.0–15.0)
MCH: 26 pg (ref 26.0–34.0)
MCHC: 32.8 g/dL (ref 30.0–36.0)
MCV: 79.2 fL (ref 78.0–100.0)
Platelets: 241 10*3/uL (ref 150–400)
RBC: 4.27 MIL/uL (ref 3.87–5.11)
RDW: 16.2 % — ABNORMAL HIGH (ref 11.5–15.5)
WBC: 10 10*3/uL (ref 4.0–10.5)

## 2014-07-13 MED ORDER — ONDANSETRON 8 MG/NS 50 ML IVPB
8.0000 mg | Freq: Once | INTRAVENOUS | Status: AC
  Administered 2014-07-13: 8 mg via INTRAVENOUS
  Filled 2014-07-13: qty 8

## 2014-07-13 MED ORDER — LACTATED RINGERS IV SOLN: INTRAVENOUS | Status: AC

## 2014-07-13 MED ORDER — ZOLPIDEM TARTRATE 5 MG PO TABS: 5.0000 mg | ORAL_TABLET | Freq: Every evening | ORAL | Status: DC | PRN

## 2014-07-13 MED ORDER — LACTATED RINGERS IV BOLUS (SEPSIS)
500.0000 mL | Freq: Once | INTRAVENOUS | Status: AC
  Administered 2014-07-13: 500 mL via INTRAVENOUS

## 2014-07-13 NOTE — MAU Note (Signed)
Anne HeckJessica Chan informed of pt 's presence on unit

## 2014-07-13 NOTE — MAU Note (Signed)
Sitting on the couch, watching tv. Next thing she knows her phone was going off- she 'woke up' had no idea what had happened and she felt really dizzy.  Still feeling dizzy. ( promptly stood up when called, carrying daughter- advised not to do that if she is dizzy, gait steady)

## 2014-07-13 NOTE — MAU Note (Signed)
Pt states she has not been sleeping. Pt states one minute she was sitting up and when she woke  she was laying down.

## 2014-07-13 NOTE — MAU Provider Note (Signed)
History   Patient is a 17y.o. G2P1001 at 9.4wks who presents s/p fainting with current dizziness.  Patient states around 3pm she was sitting on the couch and then woke up laying down.  Patient admits that sleep has been inadequate for 2 days as she has been experiencing issues going to sleep and staying asleep.  Patient reports adequate hydration and nutritional intake.  Patient also reports dizziness and nausea which she has been experiencing since "2-[redacted]wks pregnant."  Currently taking diclegis with no relief.  Patient does have a 207 month old daughter at home.  Patient denies other issues and concerns at present.   Patient Active Problem List   Diagnosis Date Noted  . Normal labor and delivery 12/18/2013  . Vaginal delivery 12/18/2013  . Smoker 12/06/2013  . Migraine, unspecified, without mention of intractable migraine without mention of status migrainosus 12/06/2013  . Hx of pyelonephritis--2013 12/06/2013  . Hearing loss in right ear 12/06/2013  . Asthma, chronic--exercise induced 12/06/2013  . 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 12/06/2013  . Fibromyalgia/chronic fatigue 12/06/2013  . Allergy history, drug--prednisone 12/06/2013  . ABDOMINAL PAIN RIGHT LOWER QUADRANT 01/20/2008  . CONSTIPATION, HX OF 01/20/2008    Chief Complaint  Patient presents with  . Loss of Consciousness   HPI  OB History   Grav Para Term Preterm Abortions TAB SAB Ect Mult Living   2 1 1       1       Past Medical History  Diagnosis Date  . Fibromyalgia   . Chronic fatigue   . Asthma     exercise induced    Past Surgical History  Procedure Laterality Date  . No past surgeries      Family History  Problem Relation Age of Onset  . Hypertension Mother   . Hyperlipidemia Father   . Hypertension Father   . Heart disease Sister   . Asthma Sister   . Multiple sclerosis Paternal Aunt   . Hypertension Maternal Grandmother   . Cancer Maternal Grandmother   . Dementia Maternal  Grandmother     History  Substance Use Topics  . Smoking status: Current Every Day Smoker -- 0.25 packs/day    Types: Cigarettes  . Smokeless tobacco: Not on file  . Alcohol Use: No    Allergies:  Allergies  Allergen Reactions  . Other Nausea And Vomiting and Other (See Comments)    Blue Cheese causes sweating nausea and vomitting  . Prednisone Other (See Comments)    Severe Depression  . Vicodin [Hydrocodone-Acetaminophen] Nausea And Vomiting    Prescriptions prior to admission  Medication Sig Dispense Refill  . Doxylamine-Pyridoxine (DICLEGIS) 10-10 MG TBEC Take 2 tablets by mouth at bedtime.        ROS  See HPI Above Physical Exam   Blood pressure 115/71, pulse 90, temperature 98.3 F (36.8 C), temperature source Oral, resp. rate 18, weight 206 lb (93.441 kg), last menstrual period 05/07/2014, unknown if currently breastfeeding.  Results for orders placed during the hospital encounter of 07/13/14 (from the past 24 hour(s))  URINALYSIS, ROUTINE W REFLEX MICROSCOPIC     Status: None   Collection Time    07/13/14  6:40 PM      Result Value Ref Range   Color, Urine YELLOW  YELLOW   APPearance CLEAR  CLEAR   Specific Gravity, Urine 1.015  1.005 - 1.030   pH 6.5  5.0 - 8.0   Glucose, UA NEGATIVE  NEGATIVE  mg/dL   Hgb urine dipstick NEGATIVE  NEGATIVE   Bilirubin Urine NEGATIVE  NEGATIVE   Ketones, ur NEGATIVE  NEGATIVE mg/dL   Protein, ur NEGATIVE  NEGATIVE mg/dL   Urobilinogen, UA 0.2  0.0 - 1.0 mg/dL   Nitrite NEGATIVE  NEGATIVE   Leukocytes, UA NEGATIVE  NEGATIVE  CBC     Status: Abnormal   Collection Time    07/13/14  7:45 PM      Result Value Ref Range   WBC 10.0  4.0 - 10.5 K/uL   RBC 4.27  3.87 - 5.11 MIL/uL   Hemoglobin 11.1 (*) 12.0 - 15.0 g/dL   HCT 45.4 (*) 09.8 - 11.9 %   MCV 79.2  78.0 - 100.0 fL   MCH 26.0  26.0 - 34.0 pg   MCHC 32.8  30.0 - 36.0 g/dL   RDW 14.7 (*) 82.9 - 56.2 %   Platelets 241  150 - 400 K/uL     Physical Exam   Constitutional: She is oriented to person, place, and time. She appears well-developed and well-nourished.  Cardiovascular: Normal rate, regular rhythm and normal heart sounds.   Respiratory: Effort normal and breath sounds normal.  GI: Soft. Bowel sounds are normal. There is no tenderness.  Neurological: She is alert and oriented to person, place, and time.  Skin: Skin is warm and dry. There is pallor.  Psychiatric: She has a normal mood and affect. Her speech is normal and behavior is normal.    ED Course  Assessment: IUP at 9.4wks Dizziness Questionable Fainting Nausea  Plan: -Labs: UA, CBC -IV Bolus LR -Zofran 8mg   Follow Up (2050) -Labs WNL -Patient reports feeling better -Discussed need for adequate sleep -Given RX for ambien as patient states she has UTI related sx with benadryl -Patient to keep appts as scheduled: 7/28 -Patient to increase diclegis dosage to 2 pills at hs, to 1 pill in afternoon on empty stomach and 2 pills at hs -Call if you have any questions or concerns prior to your next visit.    Juliauna Stueve LYNN CNM, MSN 07/13/2014 8:12 PM

## 2014-07-13 NOTE — Discharge Instructions (Signed)
Dizziness  Dizziness means you feel unsteady or lightheaded. You might feel like you are going to pass out (faint). HOME CARE   Drink enough fluids to keep your pee (urine) clear or pale yellow.  Take your medicines exactly as told by your doctor. If you take blood pressure medicine, always stand up slowly from the lying or sitting position. Hold on to something to steady yourself.  If you need to stand in one place for a long time, move your legs often. Tighten and relax your leg muscles.  Have someone stay with you until you feel okay.  Do not drive or use heavy machinery if you feel dizzy.  Do not drink alcohol. GET HELP RIGHT AWAY IF:   You feel dizzy or lightheaded and it gets worse.  You feel sick to your stomach (nauseous), or you throw up (vomit).  You have trouble talking or walking.  You feel weak or have trouble using your arms, hands, or legs.  You cannot think clearly or have trouble forming sentences.  You have chest pain, belly (abdominal) pain, sweating, or you are short of breath.  Your vision changes.  You are bleeding.  You have problems from your medicine that seem to be getting worse. MAKE SURE YOU:   Understand these instructions.  Will watch your condition.  Will get help right away if you are not doing well or get worse. Document Released: 11/29/2011 Document Revised: 03/03/2012 Document Reviewed: 11/29/2011 [facility] Patient Information 2015 [place], [place]. This information is not intended to replace advice given to you by your health care provider. Make sure you discuss any questions you have with your health care provider. Insomnia Insomnia is frequent trouble falling and/or staying asleep. Insomnia can be a long term problem or a short term problem. Both are common. Insomnia can be a short term problem when the wakefulness is related to a certain stress or worry. Long term insomnia is often related to ongoing stress during waking hours and/or  poor sleeping habits. Overtime, sleep deprivation itself can make the problem worse. Every little thing feels more severe because you are overtired and your ability to cope is decreased. CAUSES   Stress, anxiety, and depression.  Poor sleeping habits.  Distractions such as TV in the bedroom.  Naps close to bedtime.  Engaging in emotionally charged conversations before bed.  Technical reading before sleep.  Alcohol and other sedatives. They may make the problem worse. They can hurt normal sleep patterns and normal dream activity.  Stimulants such as caffeine for several hours prior to bedtime.  Pain syndromes and shortness of breath can cause insomnia.  Exercise late at night.  Changing time zones may cause sleeping problems (jet lag). It is sometimes helpful to have someone observe your sleeping patterns. They should look for periods of not breathing during the night (sleep apnea). They should also look to see how long those periods last. If you live alone or observers are uncertain, you can also be observed at a sleep clinic where your sleep patterns will be professionally monitored. Sleep apnea requires a checkup and treatment. Give your caregivers your medical history. Give your caregivers observations your family has made about your sleep.  SYMPTOMS   Not feeling rested in the morning.  Anxiety and restlessness at bedtime.  Difficulty falling and staying asleep. TREATMENT   Your caregiver may prescribe treatment for an underlying medical disorders. Your caregiver can give advice or help if you are using alcohol or other drugs for  self-medication. Treatment of underlying problems will usually eliminate insomnia problems.  Medications can be prescribed for short time use. They are generally not recommended for lengthy use.  Over-the-counter sleep medicines are not recommended for lengthy use. They can be habit forming.  You can promote easier sleeping by making lifestyle  changes such as:  Using relaxation techniques that help with breathing and reduce muscle tension.  Exercising earlier in the day.  Changing your diet and the time of your last meal. No night time snacks.  Establish a regular time to go to bed.  Counseling can help with stressful problems and worry.  Soothing music and white noise may be helpful if there are background noises you cannot remove.  Stop tedious detailed work at least one hour before bedtime. HOME CARE INSTRUCTIONS   Keep a diary. Inform your caregiver about your progress. This includes any medication side effects. See your caregiver regularly. Take note of:  Times when you are asleep.  Times when you are awake during the night.  The quality of your sleep.  How you feel the next day. This information will help your caregiver care for you.  Get out of bed if you are still awake after 15 minutes. Read or do some quiet activity. Keep the lights down. Wait until you feel sleepy and go back to bed.  Keep regular sleeping and waking hours. Avoid naps.  Exercise regularly.  Avoid distractions at bedtime. Distractions include watching television or engaging in any intense or detailed activity like attempting to balance the household checkbook.  Develop a bedtime ritual. Keep a familiar routine of bathing, brushing your teeth, climbing into bed at the same time each night, listening to soothing music. Routines increase the success of falling to sleep faster.  Use relaxation techniques. This can be using breathing and muscle tension release routines. It can also include visualizing peaceful scenes. You can also help control troubling or intruding thoughts by keeping your mind occupied with boring or repetitive thoughts like the old concept of counting sheep. You can make it more creative like imagining planting one beautiful flower after another in your backyard garden.  During your day, work to eliminate stress. When this  is not possible use some of the previous suggestions to help reduce the anxiety that accompanies stressful situations. MAKE SURE YOU:   Understand these instructions.  Will watch your condition.  Will get help right away if you are not doing well or get worse. Document Released: 12/07/2000 Document Revised: 03/03/2012 Document Reviewed: 01/07/2008 [facility] Patient Information 2015 [place]. This information is not intended to replace advice given to you by your health care provider. Make sure you discuss any questions you have with your health care provider.

## 2014-07-20 IMAGING — US US OB COMP LESS 14 WK
1 series · 13 of 28 positions shown · non-contrast
Comparison: None.

CLINICAL DATA: 17-year-old pregnant female abdominal and pelvic
pain. Estimated gestational age of 4 weeks 3 days by LMP. Beta HCG
of 344

EXAM:
OBSTETRIC <14 WK US AND TRANSVAGINAL OB US
TECHNIQUE: Both transabdominal and transvaginal ultrasound examinations were
performed for complete evaluation of the gestation as well as the
maternal uterus, adnexal regions, and pelvic cul-de-sac.
Transvaginal technique was performed to assess early pregnancy.

[Series 1: us ob comp less 14 wks · 66 acquisitions, 13 frames shown]
[im 3/66]
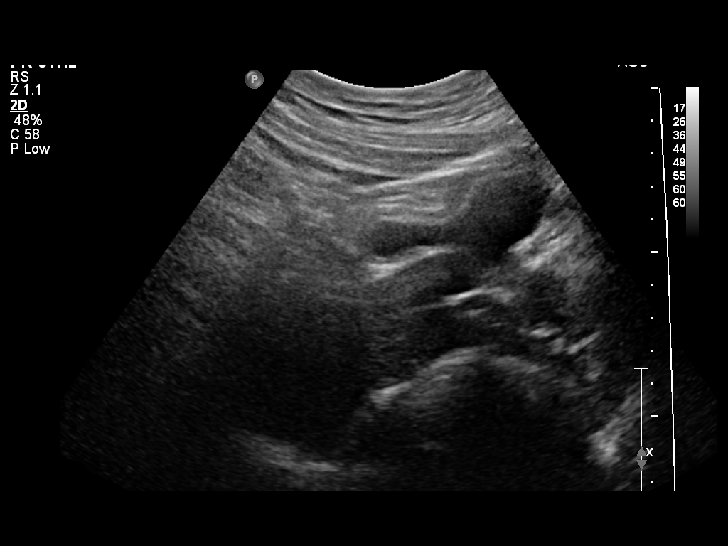
[im 8/66]
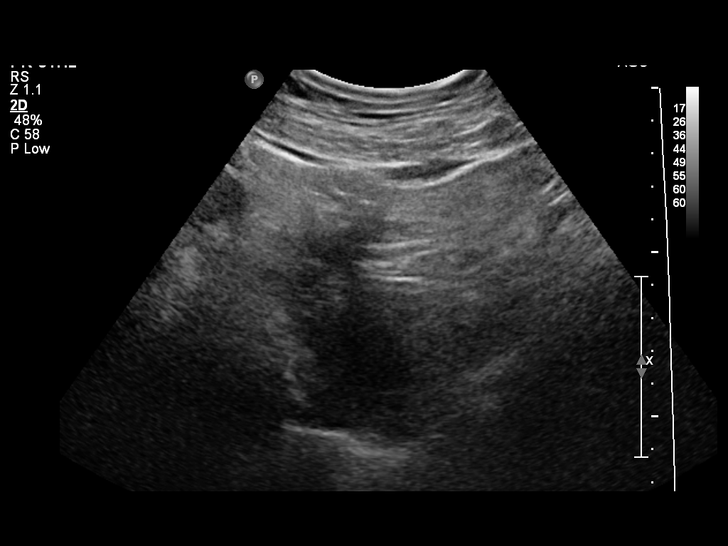
[im 13/66]
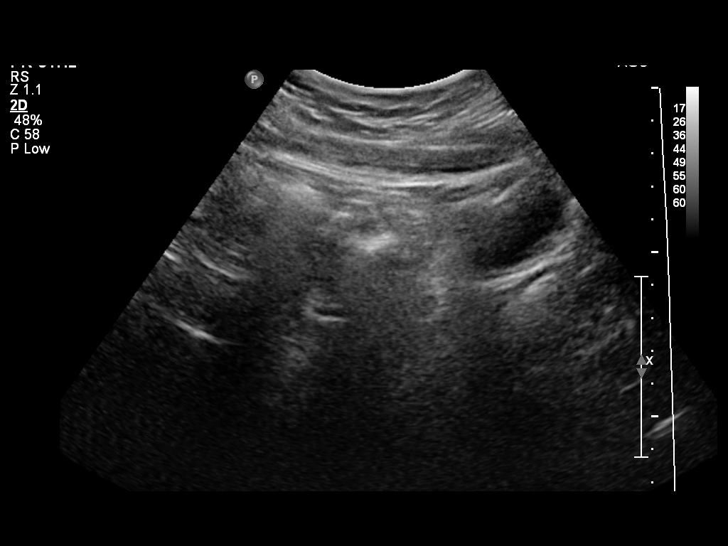
[im 17/66]
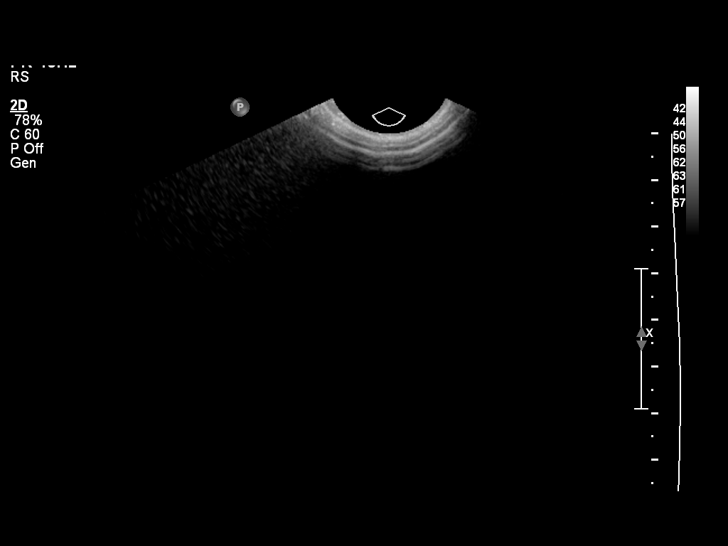
[im 22/66]
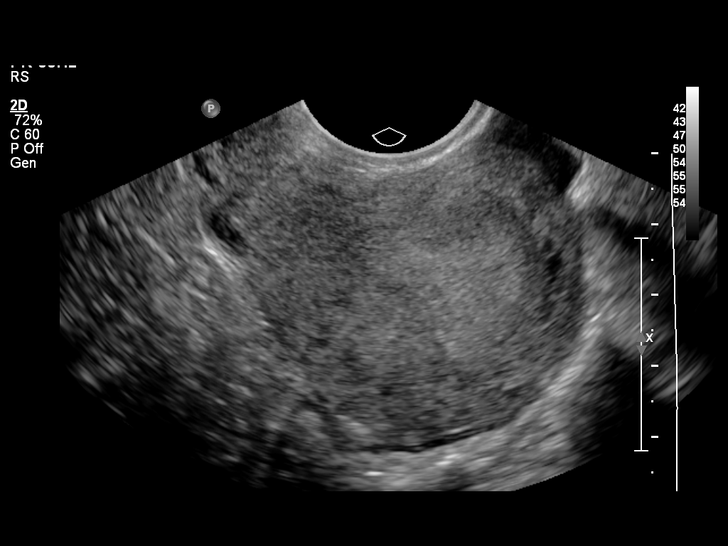
[im 27/66]
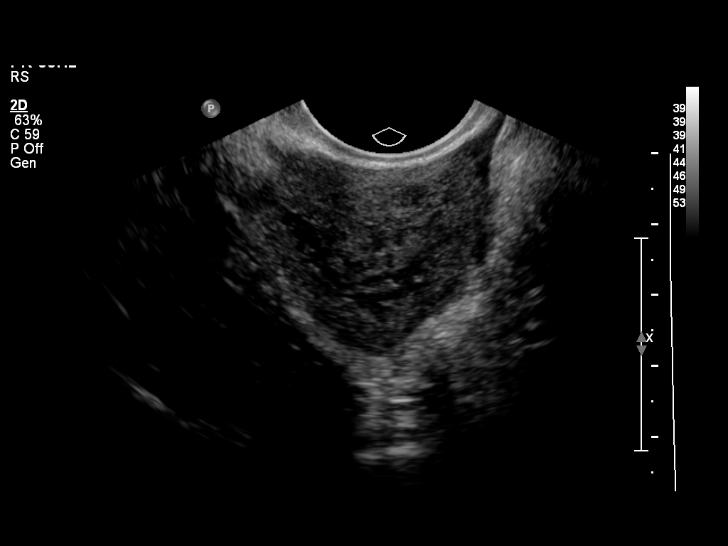
[im 34/66]
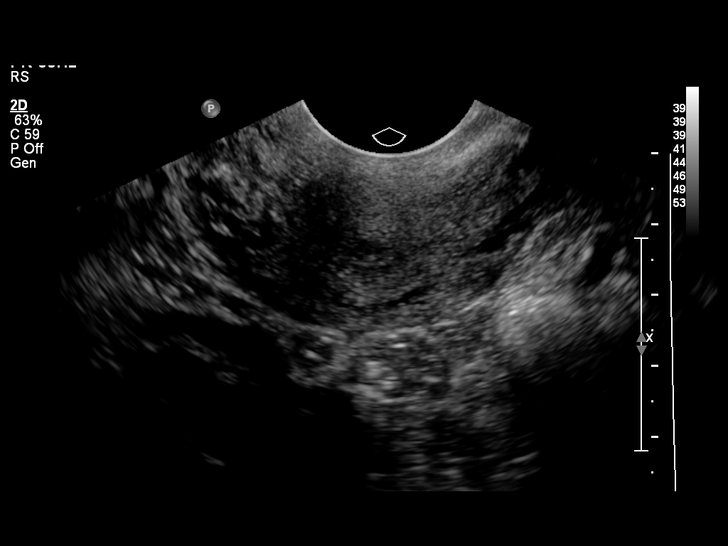
[im 39/66]
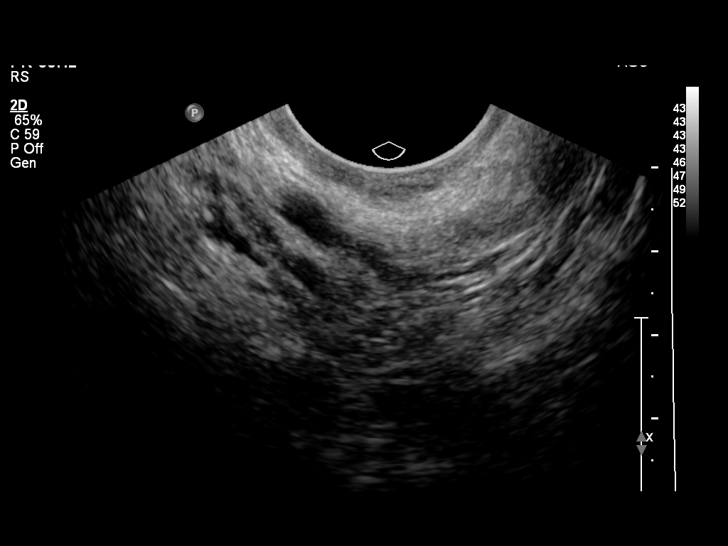
[im 44/66]
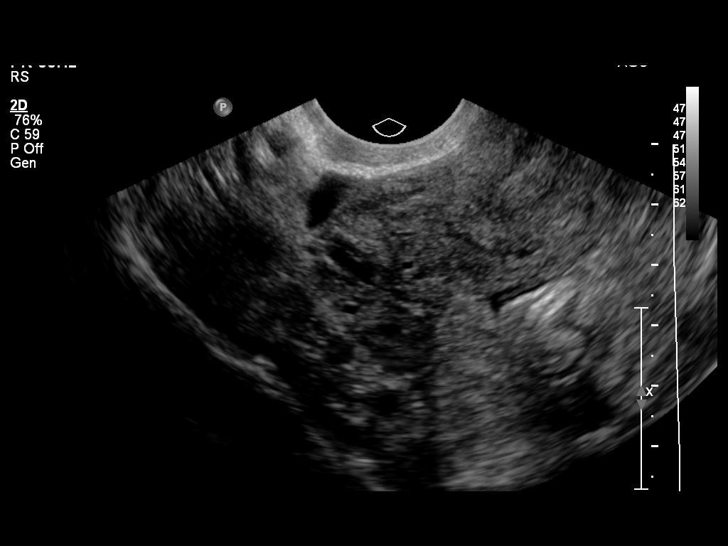
[im 49/66]
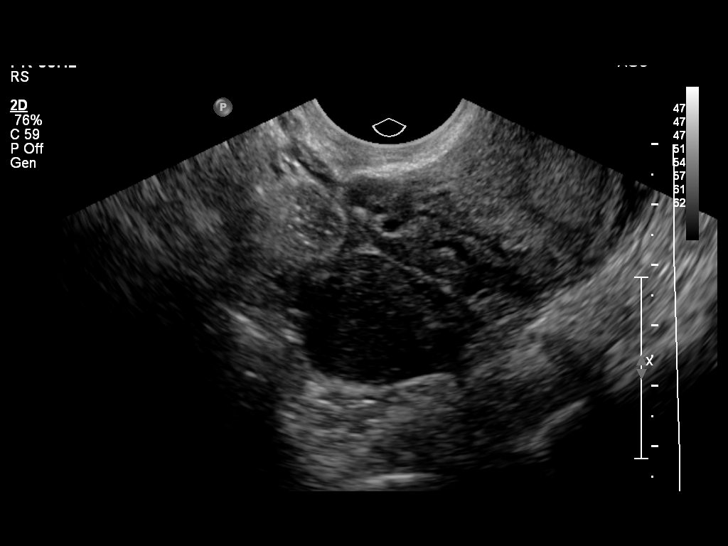
[im 53/66]
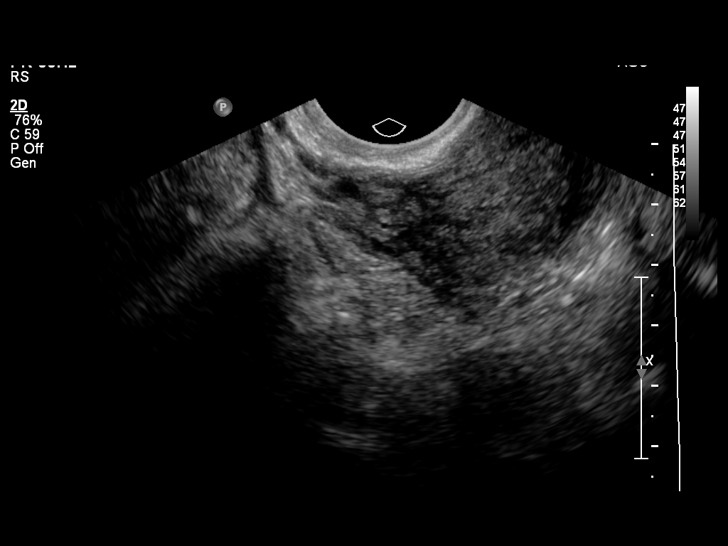
[im 58/66]
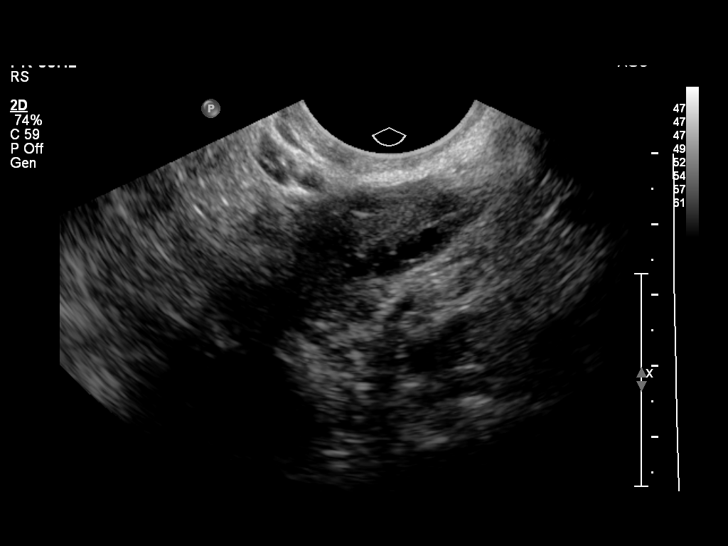
[im 63/66]
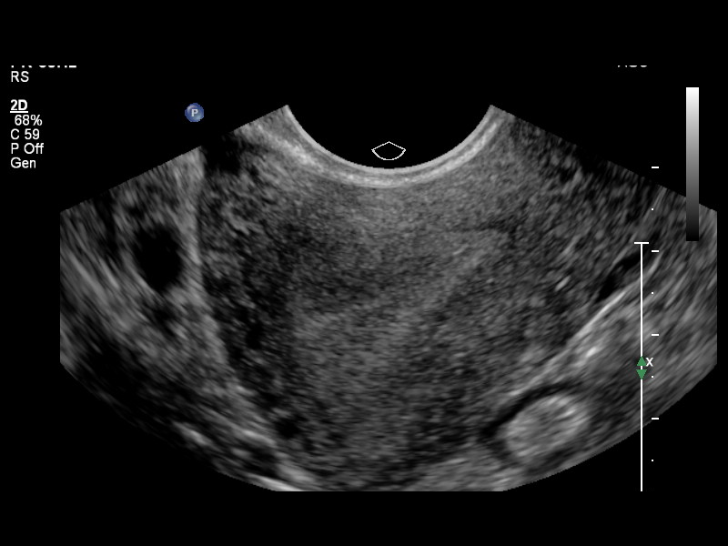

[13 of 28 positions shown; findings below may reference images not displayed]

FINDINGS: Intrauterine gestational sac: Not visualized

Yolk sac:  Not visualized

Embryo:  Not visualized

Maternal uterus/adnexae: A retroverted uterus is noted.

The endometrium is upper limits of normal in thickness.

No focal uterine masses are identified.

The ovaries bilaterally are unremarkable.

A trace amount of free pelvic fluid is noted.
IMPRESSION: No evidence of intrauterine pregnancy. Given estimated gestational
age and beta HCG, this may represent normal early pregnancy, however
a failed first trimester pregnancy or ectopic pregnancy are not
totally excluded. Recommend follow-up quantitative B-HCG levels and
follow-up US to confirm and assess viability. This recommendation
follows SRU consensus guidelines: Diagnostic Criteria for Nonviable
Pregnancy Early in the First Trimester. N Engl J Med 7896;

## 2014-07-22 ENCOUNTER — Encounter (HOSPITAL_COMMUNITY)
Admission: RE | Admit: 2014-07-22 | Discharge: 2014-07-22 | Disposition: A | Discharge status: Home / Self Care | Payer: Medicaid Other | Source: Ambulatory Visit | Attending: Obstetrics and Gynecology | Admitting: Obstetrics and Gynecology

## 2014-07-22 DIAGNOSIS — O923 Agalactia: Secondary | ICD-10-CM | POA: Insufficient documentation

## 2014-08-22 ENCOUNTER — Encounter (HOSPITAL_COMMUNITY)
Admission: RE | Admit: 2014-08-22 | Discharge: 2014-08-22 | Disposition: A | Discharge status: Home / Self Care | Payer: Medicaid Other | Source: Ambulatory Visit | Attending: Obstetrics and Gynecology | Admitting: Obstetrics and Gynecology

## 2014-08-22 DIAGNOSIS — O923 Agalactia: Secondary | ICD-10-CM | POA: Diagnosis present

## 2014-08-23 ENCOUNTER — Encounter (HOSPITAL_COMMUNITY): Payer: Self-pay | Admitting: *Deleted

## 2014-08-23 ENCOUNTER — Inpatient Hospital Stay (HOSPITAL_COMMUNITY)
Admission: AD | Admit: 2014-08-23 | Discharge: 2014-08-24 | Disposition: A | Discharge status: Home / Self Care | Payer: Medicaid Other | Source: Ambulatory Visit | Attending: Obstetrics and Gynecology | Admitting: Obstetrics and Gynecology

## 2014-08-23 DIAGNOSIS — O99330 Smoking (tobacco) complicating pregnancy, unspecified trimester: Secondary | ICD-10-CM | POA: Insufficient documentation

## 2014-08-23 DIAGNOSIS — R42 Dizziness and giddiness: Secondary | ICD-10-CM | POA: Diagnosis present

## 2014-08-23 DIAGNOSIS — O210 Mild hyperemesis gravidarum: Secondary | ICD-10-CM | POA: Insufficient documentation

## 2014-08-23 HISTORY — DX: Thyrotoxicosis, unspecified without thyrotoxic crisis or storm: E05.90

## 2014-08-23 LAB — URINALYSIS, ROUTINE W REFLEX MICROSCOPIC
Bilirubin Urine: NEGATIVE
GLUCOSE, UA: NEGATIVE mg/dL
HGB URINE DIPSTICK: NEGATIVE
Ketones, ur: NEGATIVE mg/dL
Leukocytes, UA: NEGATIVE
Nitrite: NEGATIVE
Protein, ur: NEGATIVE mg/dL
SPECIFIC GRAVITY, URINE: 1.01 (ref 1.005–1.030)
Urobilinogen, UA: 0.2 mg/dL (ref 0.0–1.0)
pH: 7 (ref 5.0–8.0)

## 2014-08-23 MED ORDER — LACTATED RINGERS IV SOLN
INTRAVENOUS | Status: AC
  Administered 2014-08-23: 23:00:00 via INTRAVENOUS

## 2014-08-23 MED ORDER — LACTATED RINGERS IV BOLUS (SEPSIS)
500.0000 mL | Freq: Once | INTRAVENOUS | Status: AC
  Administered 2014-08-23: 500 mL via INTRAVENOUS

## 2014-08-23 MED ORDER — ONDANSETRON HCL 4 MG/2ML IJ SOLN
4.0000 mg | Freq: Once | INTRAMUSCULAR | Status: AC
  Administered 2014-08-23: 4 mg via INTRAVENOUS
  Filled 2014-08-23: qty 2

## 2014-08-23 NOTE — MAU Note (Signed)
PT SAYS   SHE WAS ON SOFA AT 3PM AND FELT DIZZY-  SHE ATE   THEN FELT  MORE DIZZY.   SAYS HER HEAD  IS  SPINNING.   SHE VOMITED  AT 730PM.  -  DID NOT TAKE ER ZOFRAN OR DICLEGIS.    WAS AT [facility]  ON Thursday-    FOLLOW-UP  ON THYROID TEST.       LAST SEX-   June.     NEXT APPOINTMENT  FOR [facility]-  NEXT Thursday.

## 2014-08-23 NOTE — MAU Provider Note (Signed)
History    Patient is a 18y.o. G2P1001 at 14.6wks who presents, unannounced, s/p dizziness. Patient states around 3pm she was sitting on the couch when she experienced episode of dizziness in which she felt like she was going to faint. Patient also reports vomiting throughout the day and states she took her 4 doses of diclegis yesterday, but has been unable to take any medications today.  Patient reports adequate hydration and nutritional intake.  Patient states that she presents tonight to "avoid getting dehydrated" since she has been vomiting all day with last incident at 8pm. Patient denies other issues and concerns at present.    Patient Active Problem List   Diagnosis Date Noted  . Normal labor and delivery 12/18/2013  . Vaginal delivery 12/18/2013  . Smoker 12/06/2013  . Migraine, unspecified, without mention of intractable migraine without mention of status migrainosus 12/06/2013  . Hx of pyelonephritis--2013 12/06/2013  . Hearing loss in right ear 12/06/2013  . Asthma, chronic--exercise induced 12/06/2013  . RBC abnormality--small RBCs, dx at [facility], told to take Fe. 12/06/2013  . Fibromyalgia/chronic fatigue 12/06/2013  . Allergy history, drug--prednisone 12/06/2013  . ABDOMINAL PAIN RIGHT LOWER QUADRANT 01/20/2008  . CONSTIPATION, HX OF 01/20/2008    No chief complaint on file.  HPI  OB History   Grav Para Term Preterm Abortions TAB SAB Ect Mult Living   Past Medical History  Diagnosis Date  . Fibromyalgia   . Chronic fatigue   . Asthma     exercise induced  . Hyperthyroidism     Past Surgical History  Procedure Laterality Date  . No past surgeries    . Upper gastrointestinal endoscopy      Family History  Problem Relation Age of Onset  . Hypertension Mother   . Hyperlipidemia Father   . Hypertension Father   . Heart disease Sister   . Asthma Sister   . Multiple sclerosis Paternal Aunt   . Hypertension Maternal Grandmother   . Cancer  Maternal Grandmother   . Dementia Maternal Grandmother     History  Substance Use Topics  . Smoking status: Current Some Day Smoker -- 0.25 packs/day    Types: Cigarettes  . Smokeless tobacco: Not on file  . Alcohol Use: No    Allergies:  Allergies  Allergen Reactions  . Other Nausea And Vomiting and Other (See Comments)    Blue Cheese causes sweating nausea and vomitting  . Prednisone Other (See Comments)    Severe Depression  . Vicodin [Hydrocodone-Acetaminophen] Nausea And Vomiting    Prescriptions prior to admission  Medication Sig Dispense Refill  . Doxylamine-Pyridoxine (DICLEGIS) 10-10 MG TBEC Take 4 tablets by mouth daily. Take one tablet in the morning, 1 in the afternoon, and 2 at bedtime.      . ondansetron (ZOFRAN) 4 MG tablet Take 4 mg by mouth every 8 (eight) hours as needed for nausea or vomiting.      . Prenatal Vit-Fe Fumarate-FA (PRENATAL MULTIVITAMIN) TABS tablet Take 1 tablet by mouth daily at 12 noon.        ROS  See HPI Above Physical Exam   Blood pressure 117/69, pulse 91, temperature 98.5 F (36.9 C), temperature source Oral, resp. rate 18, height  (1.626 m), weight 205 lb 6 oz (93.157 kg), last menstrual period 05/07/2014, SpO2 99.00%, unknown if currently breastfeeding.  Results for orders placed during the hospital encounter of 08/23/14 (  from the past 24 hour(s))  URINALYSIS, ROUTINE W REFLEX MICROSCOPIC     Status: None   Collection Time    08/23/14  9:54 PM      Result Value Ref Range   Color, Urine YELLOW  YELLOW   APPearance CLEAR  CLEAR   Specific Gravity, Urine 1.010  1.005 - 1.030   pH 7.0  5.0 - 8.0   Glucose, UA NEGATIVE  NEGATIVE mg/dL   Hgb urine dipstick NEGATIVE  NEGATIVE   Bilirubin Urine NEGATIVE  NEGATIVE   Ketones, ur NEGATIVE  NEGATIVE mg/dL   Protein, ur NEGATIVE  NEGATIVE mg/dL   Urobilinogen, UA 0.2  0.0 - 1.0 mg/dL   Nitrite NEGATIVE  NEGATIVE   Leukocytes, UA NEGATIVE  NEGATIVE  CBC     Status: Abnormal    Collection Time    08/24/14 12:34 AM      Result Value Ref Range   WBC 9.4  4.0 - 10.5 K/uL   RBC 3.93  3.87 - 5.11 MIL/uL   Hemoglobin 10.4 (*) 12.0 - 15.0 g/dL   HCT 14.7 (*) 82.9 - 56.2 %   MCV 79.9  78.0 - 100.0 fL   MCH 26.5  26.0 - 34.0 pg   MCHC 33.1  30.0 - 36.0 g/dL   RDW 13.0 (*) 86.5 - 78.4 %   Platelets 199  150 - 400 K/uL  GLUCOSE, CAPILLARY     Status: None   Collection Time    08/24/14 12:35 AM      Result Value Ref Range   Glucose-Capillary 88  70 - 99 mg/dL   Comment 1 Notify RN      Physical Exam  Constitutional: She is oriented to person, place, and time. She appears well-developed and well-nourished.  Cardiovascular: Normal rate.   Respiratory: Effort normal.  GI: Soft.  Neurological: She is alert and oriented to person, place, and time.  Skin: Skin is warm and dry.   FHR per Doppler: 141 ED Course  Assessment: IUP at 14.6wks Nausea/Vomiting Dizziness  Plan: -Labs: UA -IV Bolus of LR -IV dose of Zofran  Follow Up (1223) -UA: WNL -Patient reports that nausea is gone, but dizziness remains -Labs: CBG, CBC -Discussed sleeping habits and life stressors, patient admits irregular sleep and increased stress due to break up -Patient declines counseling/mental health services at current  Follow Up (0103) -Labs:  *H&H/plts-10.4/31.4/199 *CBG: 88 -Patient with h/o anemia, no Fe supplementation due to N/V -Discussed medication mgmt for nausea -Discussed dizziness and when to call -Patient instructed to take at least one dose of diclegis tonight while feeling better -Keep appt as scheduled: 09/02/2014 -Bleeding Precautions Given -Call if you have any questions or concerns prior to your next visit.  -Discharge to home, stable condition  Llewellyn Choplin LYNN CNM, MSN 08/23/2014 11:05 PM

## 2014-08-24 LAB — CBC
HEMATOCRIT: 31.4 % — AB (ref 36.0–46.0)
Hemoglobin: 10.4 g/dL — ABNORMAL LOW (ref 12.0–15.0)
MCH: 26.5 pg (ref 26.0–34.0)
MCHC: 33.1 g/dL (ref 30.0–36.0)
MCV: 79.9 fL (ref 78.0–100.0)
PLATELETS: 199 10*3/uL (ref 150–400)
RBC: 3.93 MIL/uL (ref 3.87–5.11)
RDW: 16.5 % — ABNORMAL HIGH (ref 11.5–15.5)
WBC: 9.4 10*3/uL (ref 4.0–10.5)

## 2014-08-24 LAB — GLUCOSE, CAPILLARY: Glucose-Capillary: 88 mg/dL (ref 70–99)

## 2014-08-24 NOTE — Discharge Instructions (Signed)
Stress Stress-related medical problems are becoming increasingly common. The body has a built-in physical response to stressful situations. Faced with pressure, challenge or danger, we need to react quickly. Our bodies release hormones such as cortisol and adrenaline to help do this. These hormones are part of the "fight or flight" response and affect the metabolic rate, heart rate and blood pressure, resulting in a heightened, stressed state that prepares the body for optimum performance in dealing with a stressful situation. It is likely that early man required these mechanisms to stay alive, but usually modern stresses do not call for this, and the same hormones released in today's world can damage health and reduce coping ability. CAUSES  Pressure to perform at work, at school or in sports.  Threats of physical violence.  Money worries.  Arguments.  Family conflicts.  Divorce or separation from significant other.  Bereavement.  New job or unemployment.  Changes in location.  Alcohol or drug abuse. SOMETIMES, THERE IS NO PARTICULAR REASON FOR DEVELOPING STRESS. Almost all people are at risk of being stressed at some time in their lives. It is important to know that some stress is temporary and some is long term.  Temporary stress will go away when a situation is resolved. Most people can cope with short periods of stress, and it can often be relieved by relaxing, taking a walk or getting any type of exercise, chatting through issues with friends, or having a good night's sleep.  Chronic (long-term, continuous) stress is much harder to deal with. It can be psychologically and emotionally damaging. It can be harmful both for an individual and for friends and family. SYMPTOMS Everyone reacts to stress differently. There are some common effects that help [place] recognize it. In times of extreme stress, people may:  Shake uncontrollably.  Breathe faster and deeper than normal  (hyperventilate).  Vomit.  For people with asthma, stress can trigger an attack.  For some people, stress may trigger migraine headaches, ulcers, and body pain. PHYSICAL EFFECTS OF STRESS MAY INCLUDE:  Loss of energy.  Skin problems.  Aches and pains resulting from tense muscles, including neck ache, backache and tension headaches.  Increased pain from arthritis and other conditions.  Irregular heart beat (palpitations).  Periods of irritability or anger.  Apathy or depression.  Anxiety (feeling uptight or worrying).  Unusual behavior.  Loss of appetite.  Comfort eating.  Lack of concentration.  Loss of, or decreased, sex-drive.  Increased smoking, drinking, or recreational drug use.  For women, missed periods.  Ulcers, joint pain, and muscle pain. Post-traumatic stress is the stress caused by any serious accident, strong emotional damage, or extremely difficult or violent experience such as rape or war. Post-traumatic stress victims can experience mixtures of emotions such as fear, shame, depression, guilt or anger. It may include recurrent memories or images that may be haunting. These feelings can last for weeks, months or even years after the traumatic event that triggered them. Specialized treatment, possibly with medicines and psychological therapies, is available. If stress is causing physical symptoms, severe distress or making it difficult for you to function as normal, it is worth seeing your caregiver. It is important to remember that although stress is a usual part of life, extreme or prolonged stress can lead to other illnesses that will need treatment. It is better to visit a doctor sooner rather than later. Stress has been linked to the development of high blood pressure and heart disease, as well as insomnia and depression.  There is no diagnostic test for stress since everyone reacts to it differently. But a caregiver will be able to spot the physical  symptoms, such as:  Headaches.  Shingles.  Ulcers. Emotional distress such as intense worry, low mood or irritability should be detected when the doctor asks pertinent questions to identify any underlying problems that might be the cause. In case there are physical reasons for the symptoms, the doctor may also want to do some tests to exclude certain conditions. If you feel that you are suffering from stress, try to identify the aspects of your life that are causing it. Sometimes you may not be able to change or avoid them, but even a small change can have a positive ripple effect. A simple lifestyle change can make all the difference. STRATEGIES THAT CAN HELP DEAL WITH STRESS:  Delegating or sharing responsibilities.  Avoiding confrontations.  Learning to be more assertive.  Regular exercise.  Avoid using alcohol or street drugs to cope.  Eating a healthy, balanced diet, rich in fruit and vegetables and proteins.  Finding humor or absurdity in stressful situations.  Never taking on more than you know you can handle comfortably.  Organizing your time better to get as much done as possible.  Talking to friends or family and sharing your thoughts and fears.  Listening to music or relaxation tapes.  Relaxation techniques like deep breathing, meditation, and yoga.  Tensing and then relaxing your muscles, starting at the toes and working up to the head and neck. If you think that you would benefit from help, either in identifying the things that are causing your stress or in learning techniques to help you relax, see a caregiver who is capable of helping you with this. Rather than relying on medications, it is usually better to try and identify the things in your life that are causing stress and try to deal with them. There are many techniques of managing stress including counseling, psychotherapy, aromatherapy, yoga, and exercise. Your caregiver can help you determine what is best  for you. Document Released: 03/02/2003 Document Revised: 12/15/2013 Document Reviewed: 01/27/2008 [facility] Patient Information 2015 [place], [place]. This information is not intended to replace advice given to you by your health care provider. Make sure you discuss any questions you have with your health care provider. Depression Depression refers to feeling sad, low, down in the dumps, blue, gloomy, or empty. In general, there are two kinds of depression: 1. Normal sadness or normal grief. This kind of depression is one that we all feel from time to time after upsetting life experiences, such as the loss of a job or the ending of a relationship. This kind of depression is considered normal, is short lived, and resolves within a few days to 2 weeks. Depression experienced after the loss of a loved one (bereavement) often lasts longer than 2 weeks but normally gets better with time. 2. Clinical depression. This kind of depression lasts longer than normal sadness or normal grief or interferes with your ability to function at home, at work, and in school. It also interferes with your personal relationships. It affects almost every aspect of your life. Clinical depression is an illness. Symptoms of depression can also be caused by conditions other than those mentioned above, such as:  Physical illness. Some physical illnesses, including underactive thyroid gland (hypothyroidism), severe anemia, specific types of cancer, diabetes, uncontrolled seizures, heart and lung problems, strokes, and chronic pain are commonly associated with symptoms of depression.  Side  effects of some prescription medicine. In some people, certain types of medicine can cause symptoms of depression.  Substance abuse. Abuse of alcohol and illicit drugs can cause symptoms of depression. SYMPTOMS Symptoms of normal sadness and normal grief include the following:  Feeling sad or crying for short periods of time.  Not caring about  anything (apathy).  Difficulty sleeping or sleeping too much.  No longer able to enjoy the things you used to enjoy.  Desire to be by oneself all the time (social isolation).  Lack of energy or motivation.  Difficulty concentrating or remembering.  Change in appetite or weight.  Restlessness or agitation. Symptoms of clinical depression include the same symptoms of normal sadness or normal grief and also the following symptoms:  Feeling sad or crying all the time.  Feelings of guilt or worthlessness.  Feelings of hopelessness or helplessness.  Thoughts of suicide or the desire to harm yourself (suicidal ideation).  Loss of touch with reality (psychotic symptoms). Seeing or hearing things that are not real (hallucinations) or having false beliefs about your life or the people around you (delusions and paranoia). DIAGNOSIS  The diagnosis of clinical depression is usually based on how bad the symptoms are and how long they have lasted. Your health care provider will also ask you questions about your medical history and substance use to find out if physical illness, use of prescription medicine, or substance abuse is causing your depression. Your health care provider may also order blood tests. TREATMENT  Often, normal sadness and normal grief do not require treatment. However, sometimes antidepressant medicine is given for bereavement to ease the depressive symptoms until they resolve. The treatment for clinical depression depends on how bad the symptoms are but often includes antidepressant medicine, counseling with a mental health professional, or both. Your health care provider will help to determine what treatment is best for you. Depression caused by physical illness usually goes away with appropriate medical treatment of the illness. If prescription medicine is causing depression, talk with your health care provider about stopping the medicine, decreasing the dose, or changing to  another medicine. Depression caused by the abuse of alcohol or illicit drugs goes away when you stop using these substances. Some adults need professional help in order to stop drinking or using drugs. SEEK IMMEDIATE MEDICAL CARE IF:  You have thoughts about hurting yourself or others.  You lose touch with reality (have psychotic symptoms).  You are taking medicine for depression and have a serious side effect. FOR MORE INFORMATION  National Alliance on Mental Illness: www.nami.[employer] of Mental Health: http://www.maynard.net/ Document Released: 12/07/2000 Document Revised: 04/26/2014 Document Reviewed: 03/10/2012 [facility] Patient Information 2015 [place], [place]. This information is not intended to replace advice given to you by your health care provider. Make sure you discuss any questions you have with your health care provider. Dizziness  Dizziness means you feel unsteady or lightheaded. You might feel like you are going to pass out (faint). HOME CARE   Drink enough fluids to keep your pee (urine) clear or pale yellow.  Take your medicines exactly as told by your doctor. If you take blood pressure medicine, always stand up slowly from the lying or sitting position. Hold on to something to steady yourself.  If you need to stand in one place for a long time, move your legs often. Tighten and relax your leg muscles.  Have someone stay with you until you feel okay.  Do not drive or use  heavy machinery if you feel dizzy.  Do not drink alcohol. GET HELP RIGHT AWAY IF:   You feel dizzy or lightheaded and it gets worse.  You feel sick to your stomach (nauseous), or you throw up (vomit).  You have trouble talking or walking.  You feel weak or have trouble using your arms, hands, or legs.  You cannot think clearly or have trouble forming sentences.  You have chest pain, belly (abdominal) pain, sweating, or you are short of breath.  Your vision changes.  You are  bleeding.  You have problems from your medicine that seem to be getting worse. MAKE SURE YOU:   Understand these instructions.  Will watch your condition.  Will get help right away if you are not doing well or get worse. Document Released: 11/29/2011 Document Revised: 03/03/2012 Document Reviewed: 11/29/2011 [facility] Patient Information 2015 [place], [place]. This information is not intended to replace advice given to you by your health care provider. Make sure you discuss any questions you have with your health care provider.

## 2014-09-22 ENCOUNTER — Encounter (HOSPITAL_COMMUNITY)
Admission: RE | Admit: 2014-09-22 | Discharge: 2014-09-22 | Disposition: A | Discharge status: Home / Self Care | Payer: Medicaid Other | Source: Ambulatory Visit | Attending: Obstetrics and Gynecology | Admitting: Obstetrics and Gynecology

## 2014-09-22 DIAGNOSIS — O923 Agalactia: Secondary | ICD-10-CM | POA: Insufficient documentation

## 2014-10-20 ENCOUNTER — Emergency Department (HOSPITAL_COMMUNITY)
Admission: EM | Admit: 2014-10-20 | Discharge: 2014-10-20 | Disposition: A | Discharge status: Home / Self Care | Payer: No Typology Code available for payment source | Attending: Emergency Medicine | Admitting: Emergency Medicine

## 2014-10-20 ENCOUNTER — Encounter (HOSPITAL_COMMUNITY): Payer: Self-pay | Admitting: Emergency Medicine

## 2014-10-20 DIAGNOSIS — J45909 Unspecified asthma, uncomplicated: Secondary | ICD-10-CM | POA: Diagnosis not present

## 2014-10-20 DIAGNOSIS — O9989 Other specified diseases and conditions complicating pregnancy, childbirth and the puerperium: Secondary | ICD-10-CM | POA: Diagnosis not present

## 2014-10-20 DIAGNOSIS — Z79899 Other long term (current) drug therapy: Secondary | ICD-10-CM | POA: Insufficient documentation

## 2014-10-20 DIAGNOSIS — Z041 Encounter for examination and observation following transport accident: Secondary | ICD-10-CM | POA: Insufficient documentation

## 2014-10-20 DIAGNOSIS — F17210 Nicotine dependence, cigarettes, uncomplicated: Secondary | ICD-10-CM | POA: Insufficient documentation

## 2014-10-20 DIAGNOSIS — Z8739 Personal history of other diseases of the musculoskeletal system and connective tissue: Secondary | ICD-10-CM | POA: Insufficient documentation

## 2014-10-20 DIAGNOSIS — Z8639 Personal history of other endocrine, nutritional and metabolic disease: Secondary | ICD-10-CM | POA: Insufficient documentation

## 2014-10-20 DIAGNOSIS — Z3490 Encounter for supervision of normal pregnancy, unspecified, unspecified trimester: Secondary | ICD-10-CM

## 2014-10-20 DIAGNOSIS — R5383 Other fatigue: Secondary | ICD-10-CM | POA: Insufficient documentation

## 2014-10-20 DIAGNOSIS — O99332 Smoking (tobacco) complicating pregnancy, second trimester: Secondary | ICD-10-CM | POA: Insufficient documentation

## 2014-10-20 DIAGNOSIS — Y9389 Activity, other specified: Secondary | ICD-10-CM | POA: Insufficient documentation

## 2014-10-20 DIAGNOSIS — Y92410 Unspecified street and highway as the place of occurrence of the external cause: Secondary | ICD-10-CM | POA: Diagnosis not present

## 2014-10-20 DIAGNOSIS — O99512 Diseases of the respiratory system complicating pregnancy, second trimester: Secondary | ICD-10-CM | POA: Insufficient documentation

## 2014-10-20 DIAGNOSIS — Z3A23 23 weeks gestation of pregnancy: Secondary | ICD-10-CM | POA: Diagnosis not present
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
## 2014-10-20 NOTE — ED Notes (Signed)
Dr. Goldston at bedside.  
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
## 2014-10-20 NOTE — ED Notes (Signed)
Pt. Cleared from OB.
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
## 2014-10-20 NOTE — Progress Notes (Signed)
Patient denies cramping or contractions.

## 2014-10-20 NOTE — ED Notes (Signed)
ERIN, RN from OB rapid response is at bedside. She put pt. On fetal/mom heart monitoring.

## 2014-10-20 NOTE — Progress Notes (Signed)
EFM applied, paper tracing, OBIX will not launch,  Attempts X 3 data ports

## 2014-10-20 NOTE — ED Notes (Signed)
Dr. Criss AlvineGoldston stated, "no blood draws, and no PIV."

## 2014-10-20 NOTE — Discharge Instructions (Signed)

## 2014-10-20 NOTE — ED Notes (Signed)
EMS encoded pt. As a level 2 b/c of elevated hr; pt. Was downgraded from level 2 to Acuity 3 (arrival 1434; downgrade: 1435).

## 2014-10-22 ENCOUNTER — Encounter (HOSPITAL_COMMUNITY)
Admission: RE | Admit: 2014-10-22 | Discharge: 2014-10-22 | Disposition: A | Discharge status: Home / Self Care | Payer: Medicaid Other | Source: Ambulatory Visit | Attending: Obstetrics and Gynecology | Admitting: Obstetrics and Gynecology

## 2014-10-22 DIAGNOSIS — O923 Agalactia: Secondary | ICD-10-CM | POA: Diagnosis not present

## 2014-10-25 ENCOUNTER — Encounter (HOSPITAL_COMMUNITY): Payer: Self-pay | Admitting: Emergency Medicine

## 2014-11-22 ENCOUNTER — Encounter (HOSPITAL_COMMUNITY)
Admission: RE | Admit: 2014-11-22 | Discharge: 2014-11-22 | Disposition: A | Discharge status: Home / Self Care | Payer: Medicaid Other | Source: Ambulatory Visit | Attending: Obstetrics and Gynecology | Admitting: Obstetrics and Gynecology

## 2014-11-22 DIAGNOSIS — O923 Agalactia: Secondary | ICD-10-CM | POA: Diagnosis present

## 2014-12-22 ENCOUNTER — Encounter (HOSPITAL_COMMUNITY)
Admission: RE | Admit: 2014-12-22 | Discharge: 2014-12-22 | Disposition: A | Discharge status: Home / Self Care | Payer: Medicaid Other | Source: Ambulatory Visit | Attending: Obstetrics and Gynecology | Admitting: Obstetrics and Gynecology

## 2014-12-22 DIAGNOSIS — O923 Agalactia: Secondary | ICD-10-CM | POA: Insufficient documentation

## 2015-01-24 ENCOUNTER — Inpatient Hospital Stay: Payer: Self-pay

## 2015-01-24 LAB — CBC WITH DIFFERENTIAL/PLATELET
BASOS ABS: 0 10*3/uL (ref 0.0–0.1)
BASOS PCT: 0.3 %
Eosinophil #: 0.1 10*3/uL (ref 0.0–0.7)
Eosinophil %: 0.6 %
HCT: 33.7 % — AB (ref 35.0–47.0)
HGB: 10.6 g/dL — AB (ref 12.0–16.0)
LYMPHS PCT: 11.7 %
Lymphocyte #: 1.5 10*3/uL (ref 1.0–3.6)
MCH: 24.8 pg — ABNORMAL LOW (ref 26.0–34.0)
MCHC: 31.5 g/dL — ABNORMAL LOW (ref 32.0–36.0)
MCV: 79 fL — ABNORMAL LOW (ref 80–100)
Monocyte #: 0.6 x10 3/mm (ref 0.2–0.9)
Monocyte %: 4.8 %
NEUTROS ABS: 10.9 10*3/uL — AB (ref 1.4–6.5)
Neutrophil %: 82.6 %
Platelet: 261 10*3/uL (ref 150–440)
RBC: 4.29 10*6/uL (ref 3.80–5.20)
RDW: 16.4 % — AB (ref 11.5–14.5)
WBC: 13.2 10*3/uL — ABNORMAL HIGH (ref 3.6–11.0)

## 2015-01-25 LAB — GC/CHLAMYDIA PROBE AMP

## 2015-01-26 LAB — BETA STREP CULTURE(ARMC)

## 2015-01-26 LAB — HEMATOCRIT: HCT: 30 % — ABNORMAL LOW (ref 35.0–47.0)

## 2015-01-26 LAB — RAPID HIV SCREEN (HIV 1/2 AB+AG)

## 2015-04-12 ENCOUNTER — Encounter (HOSPITAL_COMMUNITY): Payer: Self-pay | Admitting: *Deleted

## 2015-05-03 NOTE — H&P (Signed)
L&D Evaluation:  History:  HPI 19 year old G2 44P0101 with EDC=15 Feb 2015 by LMP presents at 6536 6/7 weeks with c/o regular ctxs since 1630 this evening. No VB or LOF. Baby active. [facility], but was last seen 12/31 due to lapse in Medicaid? No records available and history per patient report. States no problems with this pregnancy. Close spacing of pregnancies with last delivery 12/18/2013 of a 4#13 oz baby at 36 6/7 weeks. She reports normal growth on ultrasound, last one 12/31. Reports normal labs this pregnancy, does not know blood type.GBS not done. Denies hx of STDs.   Presents with contractions   Patient's Medical History Asthma (mild)-has not used inhalers this pregnancy.  Fibromyalgia. and chronic fatigue.   Patient's Surgical History colonoscopy and EGD age 19   Medications Tylenol (Acetaminophen)  fruit chews for acid reflux. Stoped taking PNV due to N/V   Allergies Prednisone (depression), Vicodin-nausea and vomiting   Social History tobacco  quit smoking with pregnancy 1-2 mos ago.   Family History Non-Contributory   ROS:  ROS see HPI   Exam:  Vital Signs 121/80   Urine Protein not completed   General breathing with ctxs   Mental Status clear   Chest clear   Heart normal sinus rhythm, no murmur/gallop/rubs   Abdomen gravid, tender with contractions   Estimated Fetal Weight Average for gestational age, 6 1/2#   Fetal Position cephalic. Placenta fundal and to the left   Edema no edema   Pelvic no external lesions, cervix 2/50% on arrival and 3/70%/-1 to -2 one hour later.   Mebranes Intact   FHT normal rate with no decels, 150s baseline with accels   Ucx q2 min   Skin dry   Impression:  Impression IUP at 36 6/7 by patient history in early labor   Plan:  Plan EFM/NST, monitor contractions and for cervical change, Early labor-admit, IV, prenatal panel, GBS, GC/Chlamydia, clear liquids. Stadol for pain, epidural when  progressing   Comments GBS unknown-start GBs ppx Fetus-Cat 1   Electronic Signatures: Trinna BalloonGutierrez, Lizeth Bencosme L (CNM)  (Signed 02-Feb-16 01:29)  Authored: L&D Evaluation   Last Updated: 02-Feb-16 01:29 by Trinna BalloonGutierrez, Laurren Lepkowski L (CNM)

## 2015-05-03 NOTE — H&P (Signed)
L&D Evaluation:  History Expanded:  HPI 19 yo G1 at 3222 weeks gestational age presents with sudden-onset abdominal pain in her RLQ. She states that the pain is constant and is a 7 out of 10.  It does not radiate. Nothing makes it better or worse.  She has had nausea and emesis for the past 4 days and has been able to keep "nothing" down at all even sips of water during that time.  She denies fevers, chills, urinary, and vaginal symptoms.  She also states that she has had diarrhea.  She has had a cough for the past four days that has not changed, but has caused her to cough all the time.  She has tried Delsym and pseudoephedrine for the past four days with little success other than clearing up her nasal congestion.  She receives her prenatal care in [place] and we have no records on her other than her lab work.   Gravida 1   Blood Type (Maternal) O positive   Maternal HIV Negative   Rubella Results (Maternal) immune   Patient's Medical History Asthma  fibromyalgia   Patient's Surgical History none   Medications Pre Natal Vitamins   Allergies NKDA   Social History none   ROS:  ROS All systems were reviewed.  HEENT, CNS, GI, GU, Respiratory, CV, Renal and Musculoskeletal systems were found to be normal., unless noted by HPI   Exam:  Vital Signs T 98.3, P 95, RR 20, BP 134/79   Urine Protein negative dipstick   General no apparent distress   Mental Status clear   Chest diffuse, mild inspiratory and expiratory wheezing   Heart normal sinus rhythm   Abdomen gravid, mild tenderness to palpation in her RLQ overlying her uterus.  Ther e is no flank pain, no CVA tenderness, no rebound or guarding.   Back no CVAT   Edema no edema   FHT Description 135   Ucx absent   Skin no lesions   Impression:  Impression 1) abdominal pain, 2) upper respiratory infection, bronchitis (likely viral), 3) gastroenteritis   Plan:  Comments U/S of her uterus shows no sign of abruption,  Her appendix is reportedly scanned on ultrasound as "no normal nor abnormal appendix seen."  The entire abdominal scan including her hepatic/biliary system and kidneys are reported as normal.   Will give her a treatment of albuterol to help her clear up her cough if there is a reactive component to her cough.  Will give her a PO challenge as her urine dose not suggest the dehydration one would expect given nothing by mouth for four days.  I recommend she follow up soon with her obstetrician should her abdominal pain continue.  She may use conservative measures in the mean time (ice, heat, tylenol).   Of note her cervical length on ultrasound was 2.6 cm.  Preterm labor precautions given.   Follow Up Appointment need to schedule. within the next several days   Labs:  Lab Results:  Routine UA:  16-Sep-14 20:01   Color (UA) Yellow  Clarity (UA) Cloudy  Glucose (UA) Negative  Bilirubin (UA) Negative  Ketones (UA) Negative  Specific Gravity (UA) 1.015  Blood (UA) 2+  pH (UA) 6.0  Protein (UA) Negative  Nitrite (UA) Negative  Leukocyte Esterase (UA) Negative (Result(s) reported on 08 Sep 2013 at 08:35PM.)  RBC (UA) 282 /HPF  WBC (UA) 3 /HPF  Bacteria (UA) TRACE  Epithelial Cells (UA) 3 /HPF  Mucous (UA) PRESENT (Result(s)  reported on 08 Sep 2013 at 08:35PM.)   Electronic Signatures: Conard NovakJackson, Kevron Patella D (MD)  (Signed 17-Sep-14 01:07)  Authored: L&D Evaluation, Labs   Last Updated: 17-Sep-14 01:07 by Conard NovakJackson, Raylyn Speckman D (MD)

## 2015-09-29 ENCOUNTER — Encounter (HOSPITAL_COMMUNITY): Payer: Self-pay | Admitting: Emergency Medicine

## 2015-09-29 ENCOUNTER — Emergency Department (HOSPITAL_COMMUNITY)
Admission: EM | Admit: 2015-09-29 | Discharge: 2015-09-29 | Disposition: A | Discharge status: Home / Self Care | Payer: Medicaid Other | Attending: Emergency Medicine | Admitting: Emergency Medicine

## 2015-09-29 DIAGNOSIS — Z8739 Personal history of other diseases of the musculoskeletal system and connective tissue: Secondary | ICD-10-CM | POA: Insufficient documentation

## 2015-09-29 DIAGNOSIS — Z79899 Other long term (current) drug therapy: Secondary | ICD-10-CM | POA: Insufficient documentation

## 2015-09-29 DIAGNOSIS — Z720 Tobacco use: Secondary | ICD-10-CM | POA: Diagnosis not present

## 2015-09-29 DIAGNOSIS — Z8639 Personal history of other endocrine, nutritional and metabolic disease: Secondary | ICD-10-CM | POA: Insufficient documentation

## 2015-09-29 DIAGNOSIS — J029 Acute pharyngitis, unspecified: Secondary | ICD-10-CM | POA: Insufficient documentation

## 2015-09-29 DIAGNOSIS — R05 Cough: Secondary | ICD-10-CM | POA: Diagnosis present

## 2015-09-29 DIAGNOSIS — J45909 Unspecified asthma, uncomplicated: Secondary | ICD-10-CM | POA: Diagnosis not present

## 2015-09-29 LAB — RAPID STREP SCREEN (MED CTR MEBANE ONLY): STREPTOCOCCUS, GROUP A SCREEN (DIRECT): NEGATIVE

## 2015-09-29 MED ORDER — AMOXICILLIN 500 MG PO CAPS: 500.0000 mg | ORAL_CAPSULE | Freq: Three times a day (TID) | ORAL | Status: DC

## 2015-09-29 MED ORDER — AMOXICILLIN 250 MG/5ML PO SUSR: 500.0000 mg | Freq: Three times a day (TID) | ORAL | Status: DC

## 2015-09-29 NOTE — ED Notes (Signed)
Pt sts sore throat and cough x 3 days; pt sts thinks she has white patches on back of throat

## 2015-09-29 NOTE — Discharge Instructions (Signed)
Strep test was negative. Gargle. Increase fluids. Tylenol or Motrin for pain. I wrote a prescription for amoxicillin, but I would wait to see if you develop a fever or swollen glands in your neck before starting antibiotic.

## 2015-09-29 NOTE — ED Provider Notes (Signed)
CSN: [id number]     Arrival date & time 09/29/15  1222 History  By signing my name below, I, Jarvis Morgan, attest that this documentation has been prepared under the direction and in the presence of No att. providers found. Electronically Signed: Jarvis Morgan, ED Scribe. 09/29/2015. 3:12 PM.    Chief Complaint  Patient presents with  . Cough  . Sore Throat    The history is provided by the patient. No language interpreter was used.    HPI Comments: [name] is a 19 y.o. female with a h/o asthma who presents to the Emergency Department complaining of constant, moderate, sore throat onset 3 days. She reports associated intermittent cough. She states she has a h/o strep throat a lot when she was a child. Pt endorses her sore throat is exacerbated with swallowing. She has not had any meds PTA. 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 She denies any fever.    Past Medical History  Diagnosis Date  . Fibromyalgia   . Chronic fatigue   . Asthma     exercise induced  . Hyperthyroidism    Past Surgical History  Procedure Laterality Date  . No past surgeries    . Upper gastrointestinal endoscopy     Family History  Problem Relation Age of Onset  . Hypertension Mother   . Hyperlipidemia Father   . Hypertension Father   . Heart disease Sister   . Asthma Sister   . Multiple sclerosis Paternal Aunt   . Hypertension Maternal Grandmother   . Cancer Maternal Grandmother   . Dementia Maternal Grandmother    Social History  Substance Use Topics  . Smoking status: Current Some Day Smoker -- 0.25 packs/day    Types: Cigarettes  . Smokeless tobacco: None  . Alcohol Use: No   OB History    Gravida Para Term Preterm AB TAB SAB Ectopic Multiple Living   Review of Systems    Allergies  Other; Prednisone; and Vicodin  Home Medications   Prior to Admission medications   Medication Sig Start Date End  Date Taking? Authorizing Provider  acetaminophen (TYLENOL) 325 MG tablet Take 325-650 mg by mouth every 6 (six) hours as needed for mild pain or headache.    Historical Provider, MD  amoxicillin (AMOXIL) 250 MG/5ML suspension Take 10 mLs (500 mg total) by mouth 3 (three) times daily. 09/29/15   Donnetta Hutching, MD  Doxylamine-Pyridoxine (DICLEGIS) 10-10 MG TBEC Take 1-2 tablets by mouth 3 (three) times daily. Take one tablet in the morning, 1 tablet in the afternoon, and 2 tablets at bedtime.    Historical Provider, MD  ondansetron (ZOFRAN) 4 MG tablet Take 4 mg by mouth every 8 (eight) hours as needed for nausea or vomiting.    Historical Provider, MD   Triage Vitals: BP 130/90 mmHg  Pulse 94  Temp(Src) 98.4 F (36.9 C) (Oral)  Resp 18  SpO2 97%  Physical Exam  Constitutional: She is oriented to person, place, and time. She appears well-developed and well-nourished.  HENT:  Head: Normocephalic and atraumatic.  Mouth/Throat: Uvula is midline and mucous membranes are normal. Posterior oropharyngeal erythema present.  No tonsillar swelling  Eyes: Conjunctivae and EOM are normal. Pupils are equal, round, and reactive to light.  Neck: Normal range of motion. Neck supple.  Musculoskeletal: Normal range of motion.  Lymphadenopathy:  She has no cervical adenopathy.  Neurological: She is alert and oriented to person, place, and time.  Skin: Skin is warm and dry.  Psychiatric: She has a normal mood and affect. Her behavior is normal.  Nursing note and vitals reviewed.   ED Course  Procedures (including critical care time)  DIAGNOSTIC STUDIES: Oxygen Saturation is 97% on RA, normal by my interpretation.    COORDINATION OF CARE: 12:42- Will order rapid strep screen and culture. Will provide pt with abx to get filled if her symptoms progress and she develops a fever. Pt advised of plan for treatment and pt agrees.    Labs Review Labs Reviewed  RAPID STREP SCREEN (NOT AT [facility])  CULTURE,  GROUP A STREP    Imaging Review No results found. I have personally reviewed and evaluated these images and lab results as part of my medical decision-making.   EKG Interpretation None      MDM   Final diagnoses:  Pharyngitis   Patient has sore throat, but no fever or cervical adenopathy. Rapid strep negative. I prescribed amoxicillin, but I recommended that she not start the medication unless she runs a fever and has cervical adenopathy. Patient agrees. She is nontoxic-appearing.  I, Artesha Wemhoff, personally performed the services described in this documentation. All medical record entries made by the scribe were at my direction and in my presence.  I have reviewed the chart and discharge instructions and agree that the record reflects my personal performance and is accurate and complete. Masud Holub.  09/29/2015. 3:12 PM.      Donnetta Hutching, MD 09/29/15 [phone]

## 2015-10-01 LAB — CULTURE, GROUP A STREP: Strep A Culture: NEGATIVE

## 2015-10-02 ENCOUNTER — Emergency Department (HOSPITAL_COMMUNITY)
Admission: EM | Admit: 2015-10-02 | Discharge: 2015-10-02 | Disposition: A | Discharge status: Home / Self Care | Payer: Medicaid Other | Attending: Emergency Medicine | Admitting: Emergency Medicine

## 2015-10-02 ENCOUNTER — Encounter (HOSPITAL_COMMUNITY): Payer: Self-pay | Admitting: Emergency Medicine

## 2015-10-02 DIAGNOSIS — J029 Acute pharyngitis, unspecified: Secondary | ICD-10-CM

## 2015-10-02 DIAGNOSIS — M542 Cervicalgia: Secondary | ICD-10-CM | POA: Diagnosis not present

## 2015-10-02 DIAGNOSIS — Z79899 Other long term (current) drug therapy: Secondary | ICD-10-CM | POA: Diagnosis not present

## 2015-10-02 DIAGNOSIS — Z8639 Personal history of other endocrine, nutritional and metabolic disease: Secondary | ICD-10-CM | POA: Insufficient documentation

## 2015-10-02 DIAGNOSIS — R059 Cough, unspecified: Secondary | ICD-10-CM

## 2015-10-02 DIAGNOSIS — Z87891 Personal history of nicotine dependence: Secondary | ICD-10-CM | POA: Insufficient documentation

## 2015-10-02 DIAGNOSIS — R05 Cough: Secondary | ICD-10-CM

## 2015-10-02 DIAGNOSIS — J45909 Unspecified asthma, uncomplicated: Secondary | ICD-10-CM | POA: Diagnosis not present

## 2015-10-02 MED ORDER — GUAIFENESIN-CODEINE 100-10 MG/5ML PO SOLN: 5.0000 mL | Freq: Four times a day (QID) | ORAL | Status: DC | PRN

## 2015-10-02 NOTE — ED Notes (Signed)
Pt temp. 97.8 oral.

## 2015-10-02 NOTE — ED Notes (Signed)
Cough and sore throat for 6 days; seen on Thursday for the same... No meds prescribed. Worse everyday since then; starting to feel SOB with coughing and the throat pain is severe.

## 2015-10-02 NOTE — Discharge Instructions (Signed)
Cough, Adult Coughing is a reflex that clears your throat and your airways. Coughing helps to heal and protect your lungs. It is normal to cough occasionally, but a cough that happens with other symptoms or lasts a long time may be a sign of a condition that needs treatment. A cough may last only 2-3 weeks (acute), or it may last longer than 8 weeks (chronic). CAUSES Coughing is commonly caused by:  Breathing in substances that irritate your lungs.  A viral or bacterial respiratory infection.  Allergies.  Asthma.  Postnasal drip.  Smoking.  Acid backing up from the stomach into the esophagus (gastroesophageal reflux).  Certain medicines.  Chronic lung problems, including COPD (or rarely, lung cancer).  Other medical conditions such as heart failure. HOME CARE INSTRUCTIONS  Pay attention to any changes in your symptoms. Take these actions to help with your discomfort:  Take medicines only as told by your health care provider.  If you were prescribed an antibiotic medicine, take it as told by your health care provider. Do not stop taking the antibiotic even if you start to feel better.  Talk with your health care provider before you take a cough suppressant medicine.  Drink enough fluid to keep your urine clear or pale yellow.  If the air is dry, use a cold steam vaporizer or humidifier in your bedroom or your home to help loosen secretions.  Avoid anything that causes you to cough at work or at home.  If your cough is worse at night, try sleeping in a semi-upright position.  Avoid cigarette smoke. If you smoke, quit smoking. If you need help quitting, ask your health care provider.  Avoid caffeine.  Avoid alcohol.  Rest as needed. SEEK MEDICAL CARE IF:   You have new symptoms.  You cough up pus.  Your cough does not get better after 2-3 weeks, or your cough gets worse.  You cannot control your cough with suppressant medicines and you are losing sleep.  You  develop pain that is getting worse or pain that is not controlled with pain medicines.  You have a fever.  You have unexplained weight loss.  You have night sweats. SEEK IMMEDIATE MEDICAL CARE IF:  You cough up blood.  You have difficulty breathing.  Your heartbeat is very fast.   This information is not intended to replace advice given to you by your health care provider. Make sure you discuss any questions you have with your health care provider.   Document Released: 06/08/2011 Document Revised: 08/31/2015 Document Reviewed: 02/16/2015 Elsevier Interactive Patient Education 2016 [employer]  Pharyngitis Pharyngitis is a sore throat (pharynx). There is redness, pain, and swelling of your throat. HOME CARE   Drink enough fluids to keep your pee (urine) clear or pale yellow.  Only take medicine as told by your doctor.  You may get sick again if you do not take medicine as told. Finish your medicines, even if you start to feel better.  Do not take aspirin.  Rest.  Rinse your mouth (gargle) with salt water ( tsp of salt per 1 qt of water) every 1-2 hours. This will help the pain.  If you are not at risk for choking, you can suck on hard candy or sore throat lozenges. GET HELP IF:  You have large, tender lumps on your neck.  You have a rash.  You cough up green, yellow-brown, or bloody spit. GET HELP RIGHT AWAY IF:   You have a stiff neck.  You drool or cannot swallow liquids.  You throw up (vomit) or are not able to keep medicine or liquids down.  You have very bad pain that does not go away with medicine.  You have problems breathing (not from a stuffy nose). MAKE SURE YOU:   Understand these instructions.  Will watch your condition.  Will get help right away if you are not doing well or get worse.   This information is not intended to replace advice given to you by your health care provider. Make sure you discuss any questions you have with your  health care provider.   Document Released: 05/28/2008 Document Revised: 09/30/2013 Document Reviewed: 08/17/2013 Elsevier Interactive Patient Education 2016 [employer]. Lactation Precautions Report  guaiFENesin-codeine: Extreme caution for Lactation  Significance: Extreme caution  Management Level: Extreme caution  Other Available Classifications Breast-Feeding Excreted Code: Yes Breast-Feeding Rating: Excreted - warning  Management Level Qualifier Drug is excreted into breast milk in amounts that may cause sedation of a nursing infant. Use in nursing mothers who are ultra-rapid metabolizers may lead to serious adverse reactions, including death, in nursing infants.   Copyright 2016 Clinical Drug Information, LLC Version:

## 2015-10-02 NOTE — ED Provider Notes (Signed)
CSN: [id number]     Arrival date & time 10/02/15  1035 History   First MD Initiated Contact with Patient 10/02/15 1045     No chief complaint on file.    (Consider location/radiation/quality/duration/timing/severity/associated sxs/prior Treatment) HPI   The patient is a 19 year old female with history of fibromyalgia, asthma, chronic fatigue and hyperthyroidism, she seen in the ER 3 days ago with complaints of sore throat with cough. She had a negative rapid strep test and was given a prescription for amoxicillin to use if she had fever or cervical lymphadenopathy. She return to the emergency department today after worsening throat pain, cough, and neck pain, she did not feel the amoxicillin prescription, because she never had a fever and states she did not know how to feel if she had swollen lymph nodes. She has taken ibuprofen, Delsym and NyQuil, without any relief. She endorses progression of myalgias.  She denies any rhinorrhea, nasal congestion, sinus headache, chest congestion, however her cough has worsened and she explains that it is due to a sensation of choking and swelling in her throat causing her to frequent cough. She denies any productive sputum. She endorses a few episodes of posttussive emesis. She states that it hurts to breathe due to the movement of air through her throat and neck, however she denies any wheeze, or dyspnea otherwise. She has no chest pain, no chest tightness, no inspiratory chest wall pain. She denies any nausea, abdominal pain, diarrhea.  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  Past Medical History  Diagnosis Date  . Fibromyalgia   . Chronic fatigue   . Asthma     exercise induced  . Hyperthyroidism    Past Surgical History  Procedure Laterality Date  . No past surgeries    . Upper gastrointestinal endoscopy     Family History  Problem Relation Age of Onset  . Hypertension Mother   .  Hyperlipidemia Father   . Hypertension Father   . Heart disease Sister   . Asthma Sister   . Multiple sclerosis Paternal Aunt   . Hypertension Maternal Grandmother   . Cancer Maternal Grandmother   . Dementia Maternal Grandmother    Social History  Substance Use Topics  . Smoking status: Former Smoker -- 0.25 packs/day    Types: Cigarettes    Quit date: 10/24/2014  . Smokeless tobacco: None  . Alcohol Use: Yes     Comment: occasionally   OB History    Gravida Para Term Preterm AB TAB SAB Ectopic Multiple Living   Review of Systems  Constitutional: Negative for fever, chills, diaphoresis and fatigue.  HENT: Positive for sore throat and voice change. Negative for rhinorrhea, sinus pressure, sneezing and trouble swallowing.   Eyes: Negative.   Respiratory: Positive for cough. Negative for choking, chest tightness, shortness of breath, wheezing and stridor.   Cardiovascular: Negative for chest pain, palpitations and leg swelling.  Gastrointestinal: Negative.  Negative for nausea and diarrhea.  Endocrine: Negative.   Genitourinary: Negative.   Musculoskeletal: Positive for myalgias and neck pain. Negative for back pain, joint swelling, arthralgias, gait problem and neck stiffness.  Skin: Negative.  Negative for rash.  Neurological: Negative.   Psychiatric/Behavioral: Negative.       Allergies  Other; Prednisone; and Vicodin  Home Medications   Prior to Admission medications   Medication Sig Start Date  End Date Taking? Authorizing Provider  acetaminophen (TYLENOL) 325 MG tablet Take 325-650 mg by mouth every 6 (six) hours as needed for mild pain or headache.   Yes Historical Provider, MD  dextromethorphan (DELSYM) 30 MG/5ML liquid Take 30 mg by mouth as needed for cough.   Yes Historical Provider, MD  Phenyleph-Doxylamine-DM-APAP (NYQUIL SEVERE COLD/FLU PO) Take 1 capsule by mouth every evening.   Yes Historical Provider, MD  amoxicillin (AMOXIL) 250  MG/5ML suspension Take 10 mLs (500 mg total) by mouth 3 (three) times daily. Patient not taking: Reported on 10/02/2015 09/29/15   Donnetta Hutching, MD  guaiFENesin-codeine 100-10 MG/5ML syrup Take 5 mLs by mouth every 6 (six) hours as needed for cough. 10/02/15   Danelle Berry, PA-C   BP 123/74 mmHg  Pulse 87  Temp(Src) 98.4 F (36.9 C) (Oral)  Resp 20  SpO2 100%  LMP 09/12/2015 (Approximate) Physical Exam  Constitutional: She is oriented to person, place, and time. She appears well-developed and well-nourished. No distress.  HENT:  Head: Normocephalic and atraumatic.  Right Ear: Hearing, tympanic membrane, external ear and ear canal normal.  Left Ear: Hearing, tympanic membrane, external ear and ear canal normal.  Nose: Nose normal. No mucosal edema, rhinorrhea or sinus tenderness. Right sinus exhibits no maxillary sinus tenderness and no frontal sinus tenderness. Left sinus exhibits no maxillary sinus tenderness and no frontal sinus tenderness.  Mouth/Throat: Uvula is midline and mucous membranes are normal. Mucous membranes are not pale, not dry and not cyanotic. No oral lesions. No trismus in the jaw. No uvula swelling. Posterior oropharyngeal erythema present. No oropharyngeal exudate, posterior oropharyngeal edema or tonsillar abscesses.  Eyes: Conjunctivae and EOM are normal. Pupils are equal, round, and reactive to light. Right eye exhibits no discharge. Left eye exhibits no discharge. No scleral icterus.  Neck: Normal range of motion. Neck supple. No JVD present. No spinous process tenderness and no muscular tenderness present. No rigidity. No tracheal deviation, no edema, no erythema and normal range of motion present. No Brudzinski's sign and no Kernig's sign noted. No thyromegaly present.  Cardiovascular: Normal rate, regular rhythm, normal heart sounds and intact distal pulses.  Exam reveals no gallop and no friction rub.   No murmur heard. Pulmonary/Chest: Effort normal and breath sounds  normal. No accessory muscle usage or stridor. No tachypnea. No respiratory distress. She has no decreased breath sounds. She has no wheezes. She has no rhonchi. She has no rales. She exhibits no tenderness.  Abdominal: Soft. Normal appearance and bowel sounds are normal. She exhibits no distension and no mass. There is no tenderness. There is no rigidity, no rebound, no guarding, no CVA tenderness, no tenderness at McBurney's point and negative Murphy's sign.  Musculoskeletal: Normal range of motion. She exhibits no edema or tenderness.  Lymphadenopathy:    She has cervical adenopathy.       Right cervical: No superficial cervical adenopathy present.      Left cervical: No superficial cervical adenopathy present.  Neurological: She is alert and oriented to person, place, and time. She has normal reflexes. No cranial nerve deficit. She exhibits normal muscle tone. Coordination normal.  Skin: Skin is warm and dry. No rash noted. She is not diaphoretic. No erythema. No pallor.  Psychiatric: She has a normal mood and affect. Her behavior is normal. Judgment and thought content normal.  Nursing note and vitals reviewed.    ED Course  Procedures (including critical care time) Labs Review Labs Reviewed - No data to display  Imaging Review No results found. I have personally reviewed and evaluated these images and lab results as part of my medical decision-making.   EKG Interpretation None      MDM   Final diagnoses:  Pharyngitis  Cough   Pt with repeat visit for sore throat with cough, and cervical lymphadenopathy No tonsillar exudate, symmetrical tonsils, pt is afebrile, has a raspy voice and frequent cough.  She recently had a negative rapid strep and negative throat culture.  Likely a viral pharyngitis.   D/c home with cough syrup, and supportive treatment  Filed Vitals:   10/02/15 1053 10/02/15 1130 10/02/15 1200 10/02/15 1301  BP: 121/64 116/69 123/64 123/74  Pulse: 98 91 85 87   Temp: 98.4 F (36.9 C)     TempSrc: Oral     Resp: SpO2: 100% 99% 98% 100%     Danelle Berry, PA-C 10/05/15 2120  Doug Sou, MD 10/06/15 1610

## 2015-10-13 ENCOUNTER — Emergency Department (HOSPITAL_COMMUNITY)
Admission: EM | Admit: 2015-10-13 | Discharge: 2015-10-13 | Disposition: A | Discharge status: Home / Self Care | Payer: Medicaid Other | Attending: Physician Assistant | Admitting: Physician Assistant

## 2015-10-13 ENCOUNTER — Emergency Department (HOSPITAL_COMMUNITY): Payer: Medicaid Other

## 2015-10-13 ENCOUNTER — Encounter (HOSPITAL_COMMUNITY): Payer: Self-pay | Admitting: Emergency Medicine

## 2015-10-13 DIAGNOSIS — Z8639 Personal history of other endocrine, nutritional and metabolic disease: Secondary | ICD-10-CM | POA: Insufficient documentation

## 2015-10-13 DIAGNOSIS — J40 Bronchitis, not specified as acute or chronic: Secondary | ICD-10-CM

## 2015-10-13 DIAGNOSIS — Z87891 Personal history of nicotine dependence: Secondary | ICD-10-CM | POA: Insufficient documentation

## 2015-10-13 DIAGNOSIS — J029 Acute pharyngitis, unspecified: Secondary | ICD-10-CM | POA: Insufficient documentation

## 2015-10-13 DIAGNOSIS — J45909 Unspecified asthma, uncomplicated: Secondary | ICD-10-CM | POA: Diagnosis not present

## 2015-10-13 DIAGNOSIS — Z3202 Encounter for pregnancy test, result negative: Secondary | ICD-10-CM | POA: Diagnosis not present

## 2015-10-13 DIAGNOSIS — Z79899 Other long term (current) drug therapy: Secondary | ICD-10-CM | POA: Insufficient documentation

## 2015-10-13 DIAGNOSIS — R509 Fever, unspecified: Secondary | ICD-10-CM | POA: Diagnosis not present

## 2015-10-13 DIAGNOSIS — Z8739 Personal history of other diseases of the musculoskeletal system and connective tissue: Secondary | ICD-10-CM | POA: Diagnosis not present

## 2015-10-13 LAB — POC URINE PREG, ED: Preg Test, Ur: NEGATIVE

## 2015-10-13 LAB — RAPID STREP SCREEN (MED CTR MEBANE ONLY): Streptococcus, Group A Screen (Direct): NEGATIVE

## 2015-10-13 MED ORDER — AZITHROMYCIN 250 MG PO TABS: 250.0000 mg | ORAL_TABLET | Freq: Every day | ORAL | Status: DC

## 2015-10-13 MED ORDER — HYDROCODONE-ACETAMINOPHEN 7.5-325 MG/15ML PO SOLN: 15.0000 mL | Freq: Four times a day (QID) | ORAL | Status: DC | PRN

## 2015-10-13 MED ORDER — BENZONATATE 100 MG PO CAPS
100.0000 mg | ORAL_CAPSULE | Freq: Once | ORAL | Status: AC
  Administered 2015-10-13: 100 mg via ORAL
  Filled 2015-10-13: qty 1

## 2015-10-13 MED ORDER — BENZONATATE 100 MG PO CAPS: 100.0000 mg | ORAL_CAPSULE | Freq: Three times a day (TID) | ORAL | Status: DC

## 2015-10-13 NOTE — Discharge Instructions (Signed)
Sore Throat A sore throat is pain, burning, irritation, or scratchiness of the throat. There is often pain or tenderness when swallowing or talking. A sore throat may be accompanied by other symptoms, such as coughing, sneezing, fever, and swollen neck glands. A sore throat is often the first sign of another sickness, such as a cold, flu, strep throat, or mononucleosis (commonly known as mono). Most sore throats go away without medical treatment. CAUSES  The most common causes of a sore throat include:  A viral infection, such as a cold, flu, or mono.  A bacterial infection, such as strep throat, tonsillitis, or whooping cough.  Seasonal allergies.  Dryness in the air.  Irritants, such as smoke or pollution.  Gastroesophageal reflux disease (GERD). HOME CARE INSTRUCTIONS   Only take over-the-counter medicines as directed by your caregiver.  Drink enough fluids to keep your urine clear or pale yellow.  Rest as needed.  Try using throat sprays, lozenges, or sucking on hard candy to ease any pain (if older than 4 years or as directed).  Sip warm liquids, such as broth, herbal tea, or warm water with honey to relieve pain temporarily. You may also eat or drink cold or frozen liquids such as frozen ice pops.  Gargle with salt water (mix 1 tsp salt with 8 oz of water).  Do not smoke and avoid secondhand smoke.  Put a cool-mist humidifier in your bedroom at night to moisten the air. You can also turn on a hot shower and sit in the bathroom with the door closed for 5-10 minutes. SEEK IMMEDIATE MEDICAL CARE IF:  You have difficulty breathing.  You are unable to swallow fluids, soft foods, or your saliva.  You have increased swelling in the throat.  Your sore throat does not get better in 7 days.  You have nausea and vomiting.  You have a fever or persistent symptoms for more than 2-3 days.  You have a fever and your symptoms suddenly get worse. MAKE SURE YOU:   Understand  these instructions.  Will watch your condition.  Will get help right away if you are not doing well or get worse.   This information is not intended to replace advice given to you by your health care provider. Make sure you discuss any questions you have with your health care provider.   Document Released: 01/17/2005 Document Revised: 12/31/2014 Document Reviewed: 08/17/2012 Elsevier Interactive Patient Education 2016 [employer] Acute Bronchitis Bronchitis is inflammation of the airways that extend from the windpipe into the lungs (bronchi). The inflammation often causes mucus to develop. This leads to a cough, which is the most common symptom of bronchitis.  In acute bronchitis, the condition usually develops suddenly and goes away over time, usually in a couple weeks. Smoking, allergies, and asthma can make bronchitis worse. Repeated episodes of bronchitis may cause further lung problems.  CAUSES Acute bronchitis is most often caused by the same virus that causes a cold. The virus can spread from person to person (contagious) through coughing, sneezing, and touching contaminated objects. SIGNS AND SYMPTOMS   Cough.   Fever.   Coughing up mucus.   Body aches.   Chest congestion.   Chills.   Shortness of breath.   Sore throat.  DIAGNOSIS  Acute bronchitis is usually diagnosed through a physical exam. Your health care provider will also ask you questions about your medical history. Tests, such as chest X-rays, are sometimes done to rule out other conditions.  TREATMENT  Acute  bronchitis usually goes away in a couple weeks. Oftentimes, no medical treatment is necessary. Medicines are sometimes given for relief of fever or cough. Antibiotic medicines are usually not needed but may be prescribed in certain situations. In some cases, an inhaler may be recommended to help reduce shortness of breath and control the cough. A cool mist vaporizer may also be used to help thin  bronchial secretions and make it easier to clear the chest.  HOME CARE INSTRUCTIONS  Get plenty of rest.   Drink enough fluids to keep your urine clear or pale yellow (unless you have a medical condition that requires fluid restriction). Increasing fluids may help thin your respiratory secretions (sputum) and reduce chest congestion, and it will prevent dehydration.   Take medicines only as directed by your health care provider.  If you were prescribed an antibiotic medicine, finish it all even if you start to feel better.  Avoid smoking and secondhand smoke. Exposure to cigarette smoke or irritating chemicals will make bronchitis worse. If you are a smoker, consider using nicotine gum or skin patches to help control withdrawal symptoms. Quitting smoking will help your lungs heal faster.   Reduce the chances of another bout of acute bronchitis by washing your hands frequently, avoiding people with cold symptoms, and trying not to touch your hands to your mouth, nose, or eyes.   Keep all follow-up visits as directed by your health care provider.  SEEK MEDICAL CARE IF: Your symptoms do not improve after 1 week of treatment.  SEEK IMMEDIATE MEDICAL CARE IF:  You develop an increased fever or chills.   You have chest pain.   You have severe shortness of breath.  You have bloody sputum.   You develop dehydration.  You faint or repeatedly feel like you are going to pass out.  You develop repeated vomiting.  You develop a severe headache. MAKE SURE YOU:   Understand these instructions.  Will watch your condition.  Will get help right away if you are not doing well or get worse.   This information is not intended to replace advice given to you by your health care provider. Make sure you discuss any questions you have with your health care provider.   Document Released: 01/17/2005 Document Revised: 12/31/2014 Document Reviewed: 06/02/2013 Elsevier Interactive Patient  Education [employer]

## 2015-10-13 NOTE — ED Notes (Signed)
Patient is alert and orientedx4.  Patient was explained discharge instructions and they understood them with no questions.   

## 2015-10-13 NOTE — ED Notes (Signed)
C/o fever and sore throat today, no other complaints, A/O X4 and in NAD

## 2015-10-13 NOTE — ED Provider Notes (Signed)
CSN: [id number]     Arrival date & time 10/13/15  1405 History  By signing my name below, I, Jarvis Morgan, attest that this documentation has been prepared under the direction and in the presence of Marlon Pel, PA-C Electronically Signed: Jarvis Morgan, ED Scribe. 10/13/2015. 3:53 PM.    Chief Complaint  Patient presents with  . Sore Throat     The history is provided by the patient. No language interpreter was used.    HPI Comments: [name] is a 19 y.o. female with a h/o asthma and fibromyalgia who presents to the Emergency Department complaining of constant, moderate, sore throat onset 3 weeks. She reports associated intermittent fever (t-max 103 F) and cough. She endorses that the left side of her throat feels more sore than the right. Pt has been seen in the ER 2 times prior for the same. She reports she has been tested for strep 2x and both times it has come back negative. She states at her visit 1 week ago, on 10/06/15, she was told it may be a viral infection and started taking amoxicillin and was given liquid hydrocodone for mgmt of her cough. She notes she finished the round of antibiotics and reports she felt complete relief for 2-3 days until the symptoms returned this morning. Pt took Tylenol PTA for mgmt of her fever. She endorses that her symptoms are keeping her up at night. She is former smoker with a 0.25 ppd history. She admits she usually gets bronchitis or a sinus infection at least 1x per year. She states she has never seen an ENT or other specialist for this issue. 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 She denies any nausea, vomiting, abdominal pain, diarrhea or trouble swallowing.   Past Medical History  Diagnosis Date  . Fibromyalgia   . Chronic fatigue   . Asthma     exercise induced  . Hyperthyroidism    Past Surgical History  Procedure Laterality Date  . No past surgeries    . Upper gastrointestinal endoscopy      Family History  Problem Relation Age of Onset  . Hypertension Mother   . Hyperlipidemia Father   . Hypertension Father   . Heart disease Sister   . Asthma Sister   . Multiple sclerosis Paternal Aunt   . Hypertension Maternal Grandmother   . Cancer Maternal Grandmother   . Dementia Maternal Grandmother    Social History  Substance Use Topics  . Smoking status: Former Smoker -- 0.25 packs/day    Types: Cigarettes    Quit date: 10/24/2014  . Smokeless tobacco: None  . Alcohol Use: Yes     Comment: occasionally   OB History    Gravida Para Term Preterm AB TAB SAB Ectopic Multiple Living   Review of Systems  Constitutional: Positive for fever (103 F).  HENT: Positive for sore throat.   Respiratory: Positive for cough.   Gastrointestinal: Negative for nausea, vomiting, abdominal pain and diarrhea.      Allergies  Other; Prednisone; and Vicodin  Home Medications   Prior to Admission medications   Medication Sig Start Date End Date Taking? Authorizing Provider  acetaminophen (TYLENOL) 325 MG tablet Take 325-650 mg by mouth every 6 (six) hours as needed for mild pain or headache.    Historical Provider, MD  amoxicillin (AMOXIL) 250 MG/5ML suspension Take 10 mLs (500  mg total) by mouth 3 (three) times daily. Patient not taking: Reported on 10/02/2015 09/29/15   Donnetta Hutching, MD  azithromycin (ZITHROMAX) 250 MG tablet Take 1 tablet (250 mg total) by mouth daily. Take first 2 tablets together, then 1 every day until finished. 10/13/15   Tennille Montelongo Neva Seat, PA-C  benzonatate (TESSALON) 100 MG capsule Take 1 capsule (100 mg total) by mouth every 8 (eight) hours. 10/13/15   Ladeana Laplant Neva Seat, PA-C  dextromethorphan (DELSYM) 30 MG/5ML liquid Take 30 mg by mouth as needed for cough.    Historical Provider, MD  guaiFENesin-codeine 100-10 MG/5ML syrup Take 5 mLs by mouth every 6 (six) hours as needed for cough. 10/02/15   Danelle Berry, PA-C  HYDROcodone-acetaminophen (HYCET)  7.5-325 mg/15 ml solution Take 15 mLs by mouth 4 (four) times daily as needed for moderate pain. 10/13/15 10/12/16  Kanden Carey Neva Seat, PA-C  Phenyleph-Doxylamine-DM-APAP (NYQUIL SEVERE COLD/FLU PO) Take 1 capsule by mouth every evening.    Historical Provider, MD   Triage Vitals: BP 120/76 mmHg  Pulse 96  Temp(Src) 98.7 F (37.1 C) (Oral)  Resp 18  Ht  (1.702 m)  Wt 200 lb (90.719 kg)  BMI 31.32 kg/m2  SpO2 95%  LMP 08/22/2015  Physical Exam  Constitutional: She is oriented to person, place, and time. She appears well-developed and well-nourished. No distress.  HENT:  Head: Normocephalic and atraumatic.  Right Ear: Tympanic membrane normal.  Left Ear: Tympanic membrane normal.  Mouth/Throat: Uvula is midline and mucous membranes are normal. Oropharyngeal exudate and posterior oropharyngeal erythema present. No posterior oropharyngeal edema or tonsillar abscesses.  Erythematous, red, swollen tonsils w/ exudate. Tonsils are symmetric  Eyes: Conjunctivae and EOM are normal.  Neck: Neck supple. No tracheal deviation present.  Cardiovascular: Normal rate.   Pulmonary/Chest: Effort normal. No respiratory distress. She has wheezes (minimal).  Cough on exam  Musculoskeletal: Normal range of motion.  Neurological: She is alert and oriented to person, place, and time.  Skin: Skin is warm and dry.  Psychiatric: She has a normal mood and affect. Her behavior is normal.  Nursing note and vitals reviewed.   ED Course  Procedures (including critical care time)  DIAGNOSTIC STUDIES: Oxygen Saturation is 95% on RA, normal by my interpretation.    COORDINATION OF CARE:  2:29 PM- Will order rapid strep screen and CXR.  Pt advised of plan for treatment and pt agrees.  3:48 PM- Will give pt rx for Tessalon Pearls, Z-Pak, and Hycet. Will also provide referral for ENT. Pt advised of plan for treatment and pt agrees.  Labs Review Labs Reviewed  RAPID STREP SCREEN (NOT AT [facility])  CULTURE,  GROUP A STREP  POC URINE PREG, ED    Imaging Review Dg Chest 2 View  10/13/2015  CLINICAL DATA:  Occasionally productive cough for 3 weeks, had a virus, finished a round of antibiotics, sore throat, asthma, fibromyalgia EXAM: CHEST  2 VIEW COMPARISON:  03/20/2012 FINDINGS: Normal heart size, mediastinal contours, and pulmonary vascularity. Minimal chronic bronchitic changes. Lungs otherwise clear. No pleural effusion or pneumothorax. No acute bony abnormalities. IMPRESSION: Minimal chronic bronchitic changes without acute infiltrate. Electronically Signed   By: Ulyses Southward M.D.   On: 10/13/2015 15:29   I have personally reviewed and evaluated these images as part of my medical decision-making.   EKG Interpretation None      MDM   Final diagnoses:  Bronchitis  Sore throat   Take antibiotic in completion. Continue to stay well-hydrated. Gargle warm salt water  and spit it out. Continued to alternate between Tylenol and ibuprofen for pain. May consider over-the-counter Benadryl for additional relief. Followup with your primary care doctor in 5-7 days for recheck of ongoing symptoms that return to emergency department for emergent changing or worsening of symptoms.  Xray shows minimal chronic bronchitic changes without acute infiltrate. Referral to ENT  Medications  benzonatate (TESSALON) capsule 100 mg (not administered)   18 y.o.Aldean AstFrances R Provence's medical screening exam was performed and I feel the patient has had an appropriate workup for their chief complaint at this time and likelihood of emergent condition existing is low. They have been counseled on decision, discharge, follow up and which symptoms necessitate immediate return to the emergency department. They or their family verbally stated understanding and agreement with plan and discharged in stable condition.   Vital signs are stable at discharge. Filed Vitals:   10/13/15 1413  BP: 120/76  Pulse: 96  Temp: 98.7 F (37.1  C)  Resp: 18   I personally performed the services described in this documentation, which was scribed in my presence. The recorded information has been reviewed and is accurate.     Marlon Peliffany Wanisha Shiroma, PA-C 10/13/15 1554  Courteney Lyn Mackuen, MD 10/13/15 1635

## 2015-10-15 LAB — CULTURE, GROUP A STREP: STREP A CULTURE: NEGATIVE

## 2016-03-01 ENCOUNTER — Encounter (HOSPITAL_COMMUNITY): Payer: Self-pay | Admitting: Emergency Medicine

## 2016-03-01 ENCOUNTER — Emergency Department (HOSPITAL_COMMUNITY)
Admission: EM | Admit: 2016-03-01 | Discharge: 2016-03-01 | Disposition: A | Discharge status: Home / Self Care | Payer: Medicaid Other | Attending: Emergency Medicine | Admitting: Emergency Medicine

## 2016-03-01 DIAGNOSIS — R110 Nausea: Secondary | ICD-10-CM | POA: Insufficient documentation

## 2016-03-01 DIAGNOSIS — Z8639 Personal history of other endocrine, nutritional and metabolic disease: Secondary | ICD-10-CM | POA: Insufficient documentation

## 2016-03-01 DIAGNOSIS — Z8739 Personal history of other diseases of the musculoskeletal system and connective tissue: Secondary | ICD-10-CM | POA: Insufficient documentation

## 2016-03-01 DIAGNOSIS — Z87891 Personal history of nicotine dependence: Secondary | ICD-10-CM | POA: Diagnosis not present

## 2016-03-01 DIAGNOSIS — R1084 Generalized abdominal pain: Secondary | ICD-10-CM | POA: Insufficient documentation

## 2016-03-01 DIAGNOSIS — Z3202 Encounter for pregnancy test, result negative: Secondary | ICD-10-CM | POA: Diagnosis not present

## 2016-03-01 DIAGNOSIS — J45909 Unspecified asthma, uncomplicated: Secondary | ICD-10-CM | POA: Diagnosis not present

## 2016-03-01 LAB — COMPREHENSIVE METABOLIC PANEL
ALK PHOS: 79 U/L (ref 38–126)
ALT: 22 U/L (ref 14–54)
AST: 20 U/L (ref 15–41)
Albumin: 3.7 g/dL (ref 3.5–5.0)
Anion gap: 10 (ref 5–15)
BUN: 10 mg/dL (ref 6–20)
CALCIUM: 9.6 mg/dL (ref 8.9–10.3)
CHLORIDE: 108 mmol/L (ref 101–111)
CO2: 24 mmol/L (ref 22–32)
CREATININE: 0.64 mg/dL (ref 0.44–1.00)
GFR calc Af Amer: >60 mL/min (ref >60)
Glucose, Bld: 127 mg/dL — ABNORMAL HIGH (ref 65–99)
Potassium: 3.8 mmol/L (ref 3.5–5.1)
Sodium: 142 mmol/L (ref 135–145)
Total Bilirubin: 0.6 mg/dL (ref 0.3–1.2)
Total Protein: 6.7 g/dL (ref 6.5–8.1)

## 2016-03-01 LAB — CBC
HCT: 38.3 % (ref 36.0–46.0)
Hemoglobin: 12.3 g/dL (ref 12.0–15.0)
MCH: 25.1 pg — ABNORMAL LOW (ref 26.0–34.0)
MCHC: 32.1 g/dL (ref 30.0–36.0)
MCV: 78 fL (ref 78.0–100.0)
PLATELETS: 232 10*3/uL (ref 150–400)
RBC: 4.91 MIL/uL (ref 3.87–5.11)
RDW: 16.4 % — AB (ref 11.5–15.5)
WBC: 8.1 10*3/uL (ref 4.0–10.5)

## 2016-03-01 LAB — URINALYSIS, ROUTINE W REFLEX MICROSCOPIC
Bilirubin Urine: NEGATIVE
GLUCOSE, UA: NEGATIVE mg/dL
HGB URINE DIPSTICK: NEGATIVE
KETONES UR: NEGATIVE mg/dL
Leukocytes, UA: NEGATIVE
Nitrite: NEGATIVE
PROTEIN: NEGATIVE mg/dL
Specific Gravity, Urine: 1.024 (ref 1.005–1.030)
pH: 6.5 (ref 5.0–8.0)

## 2016-03-01 LAB — POC URINE PREG, ED: Preg Test, Ur: NEGATIVE

## 2016-03-01 LAB — LIPASE, BLOOD: LIPASE: 25 U/L (ref 11–51)

## 2016-03-01 MED ORDER — ONDANSETRON 4 MG PO TBDP: 4.0000 mg | ORAL_TABLET | Freq: Three times a day (TID) | ORAL | Status: DC | PRN

## 2016-03-01 NOTE — ED Notes (Signed)
Pt c/o generalized abdominal pain since this am with nausea. Denies diarrhea, constipation.

## 2016-03-01 NOTE — ED Provider Notes (Signed)
CSN: [id number]     Arrival date & time 03/01/16  2010 History  By signing my name below, I, Soijett Blue, attest that this documentation has been prepared under the direction and in the presence of Will Belvie Iribe, PA-C Electronically Signed: Soijett Blue, ED Scribe. 03/01/2016. 10:35 PM.   Chief Complaint  Patient presents with  . Abdominal Pain      The history is provided by the patient. No language interpreter was used.    HPI Comments: [name] is a 20 y.o. female with a PMHx of fibromyalgia, who presents to the Emergency Department complaining of 5/10, constant, generalized abdominal pain onset this morning. Pt notes that she took a pregnancy test at home this morning there returned positive. She states that she is having associated symptoms of nausea intermittently. No current nausea. She states that she has not tried any medications for the relief for her symptoms. She has been eating and drinking normally. She denies vomiting, diarrhea, fever, dysuria, hematuria, vaginal bleeding/discharge, appetite change, rash, CP, SOB, cough, and any other symptoms. Pt denies abdominal surgeries. Patient's last menstrual period was 02/11/2016.    Past Medical History  Diagnosis Date  . Fibromyalgia   . Chronic fatigue   . Asthma     exercise induced  . Hyperthyroidism    Past Surgical History  Procedure Laterality Date  . No past surgeries    . Upper gastrointestinal endoscopy     Family History  Problem Relation Age of Onset  . Hypertension Mother   . Hyperlipidemia Father   . Hypertension Father   . Heart disease Sister   . Asthma Sister   . Multiple sclerosis Paternal Aunt   . Hypertension Maternal Grandmother   . Cancer Maternal Grandmother   . Dementia Maternal Grandmother    Social History  Substance Use Topics  . Smoking status: Former Smoker -- 0.25 packs/day    Types: Cigarettes    Quit date: 10/24/2014  . Smokeless tobacco: None  . Alcohol Use: Yes      Comment: occasionally   OB History    Gravida Para Term Preterm AB TAB SAB Ectopic Multiple Living   Review of Systems  Constitutional: Negative for fever, chills and appetite change.  HENT: Negative for congestion and sore throat.   Eyes: Negative for visual disturbance.  Respiratory: Negative for cough and shortness of breath.   Cardiovascular: Negative for chest pain.  Gastrointestinal: Positive for nausea and abdominal pain. Negative for vomiting, diarrhea, constipation and blood in stool.  Genitourinary: Negative for dysuria, urgency, frequency, hematuria, flank pain, decreased urine volume, vaginal bleeding, vaginal discharge and difficulty urinating.  Musculoskeletal: Negative for back pain and neck pain.  Skin: Negative for rash.  Neurological: Negative for syncope and light-headedness.      Allergies  Other; Prednisone; and Vicodin  Home Medications   Prior to Admission medications   Medication Sig Start Date End Date Taking? Authorizing Provider  acetaminophen (TYLENOL) 325 MG tablet Take 325-650 mg by mouth every 6 (six) hours as needed for mild pain or headache.   Yes Historical Provider, MD  ondansetron (ZOFRAN ODT) 4 MG disintegrating tablet Take 1 tablet (4 mg total) by mouth every 8 (eight) hours as needed for nausea or vomiting. 03/01/16   Everlene Farrier, PA-C   BP 120/73 mmHg  Pulse 89  Temp(Src) 98.3 F (36.8 C) (Oral)  Resp 18  SpO2 97%  LMP 02/11/2016 Physical Exam  Constitutional: She appears well-developed and well-nourished. No distress.  Nontoxic appearing.  HENT:  Head: Normocephalic and atraumatic.  Eyes: Conjunctivae are normal. Pupils are equal, round, and reactive to light. Right eye exhibits no discharge. Left eye exhibits no discharge.  Neck: Neck supple.  Cardiovascular: Normal rate, regular rhythm, normal heart sounds and intact distal pulses.   Pulmonary/Chest: Effort normal and breath sounds normal. No respiratory  distress. She has no wheezes. She has no rales.  Abdominal: Soft. She exhibits no distension. There is no tenderness. There is no rebound, no guarding and no CVA tenderness.  Abdomen soft and non-tender. No CVA or flank tenderness. No psoas sign or obturator sign.   Musculoskeletal: She exhibits no edema.  Lymphadenopathy:    She has no cervical adenopathy.  Neurological: She is alert. Coordination normal.  Skin: Skin is warm and dry. No rash noted. She is not diaphoretic. No erythema. No pallor.  Psychiatric: She has a normal mood and affect. Her behavior is normal.  Nursing note and vitals reviewed.   ED Course  Procedures (including critical care time) DIAGNOSTIC STUDIES: Oxygen Saturation is 97% on RA, nl by my interpretation.    COORDINATION OF CARE: 10:28 PM Discussed treatment plan with pt at bedside which includes labs, UA, follow up with OB-GYN and pt agreed to plan.    Labs Review Labs Reviewed  COMPREHENSIVE METABOLIC PANEL - Abnormal; Notable for the following:    Glucose, Bld 127 (*)    All other components within normal limits  CBC - Abnormal; Notable for the following:    MCH 25.1 (*)    RDW 16.4 (*)    All other components within normal limits  LIPASE, BLOOD  URINALYSIS, ROUTINE W REFLEX MICROSCOPIC (NOT AT [facility])  POC URINE PREG, ED    Imaging Review No results found. I have personally reviewed and evaluated these lab results as part of my medical decision-making.   EKG Interpretation None      Filed Vitals:   03/01/16 2023  BP: 120/73  Pulse: 89  Temp: 98.3 F (36.8 C)  TempSrc: Oral  Resp: 18  SpO2: 97%     MDM   Meds given in ED:  Medications - No data to display  New Prescriptions   ONDANSETRON (ZOFRAN ODT) 4 MG DISINTEGRATING TABLET    Take 1 tablet (4 mg total) by mouth every 8 (eight) hours as needed for nausea or vomiting.    Final diagnoses:  Generalized abdominal pain  Nausea   This is a 20 y.o. female with a PMHx of  fibromyalgia, who presents to the Emergency Department complaining of 5/10, constant, generalized abdominal pain onset this morning. Pt notes that she took a pregnancy test at home this morning there returned positive. She states that she is having associated symptoms of nausea intermittently. No current nausea. She states that she has not tried any medications for the relief for her symptoms. She has been eating and drinking normally. She denies vomiting, diarrhea, fever, dysuria, hematuria, vaginal bleeding/discharge.  On exam the patient is afebrile and nontoxic-appearing. Her abdomen is soft and nontender to palpation. No CVA or flank tenderness. No peritoneal signs. Urinalysis is within normal limits. No signs of infection. Urine pregnancy is negative. Lipase is within normal limits. CMP is unremarkable. CBC is unremarkable. Patient's urine pregnancy is negative in the emergency department. She has no lower abdominal pain. No abdominal tenderness to palpation. No vaginal bleeding. I doubt ectopic pregnancy. I  encouraged patient to recheck pregnancy test at a later date and will have her follow-up with the women's outpatient clinic. I discussed strict and specific return precautions. I advised the patient to follow-up with their primary care provider this week. I advised the patient to return to the emergency department with new or worsening symptoms or new concerns. The patient verbalized understanding and agreement with plan.   This patient was discussed with Dr. Criss Alvine who agrees with assessment and plan.   I personally performed the services described in this documentation, which was scribed in my presence. The recorded information has been reviewed and is accurate.       Everlene Farrier, PA-C 03/01/16 1610  Pricilla Loveless, MD 03/07/16 1536

## 2016-03-01 NOTE — Discharge Instructions (Signed)
Abdominal Pain, Adult °Many things can cause abdominal pain. Usually, abdominal pain is not caused by a disease and will improve without treatment. It can often be observed and treated at home. Your health care provider will do a physical exam and possibly order blood tests and X-rays to help determine the seriousness of your pain. However, in many cases, more time must pass before a clear cause of the pain can be found. Before that point, your health care provider may not know if you need more testing or further treatment. °HOME CARE INSTRUCTIONS °Monitor your abdominal pain for any changes. The following actions may help to alleviate any discomfort you are experiencing: °· Only take over-the-counter or prescription medicines as directed by your health care provider. °· Do not take laxatives unless directed to do so by your health care provider. °· Try a clear liquid diet (broth, tea, or water) as directed by your health care provider. Slowly move to a bland diet as tolerated. °SEEK MEDICAL CARE IF: °· You have unexplained abdominal pain. °· You have abdominal pain associated with nausea or diarrhea. °· You have pain when you urinate or have a bowel movement. °· You experience abdominal pain that wakes you in the night. °· You have abdominal pain that is worsened or improved by eating food. °· You have abdominal pain that is worsened with eating fatty foods. °· You have a fever. °SEEK IMMEDIATE MEDICAL CARE IF: °· Your pain does not go away within 2 hours. °· You keep throwing up (vomiting). °· Your pain is felt only in portions of the abdomen, such as the right side or the left lower portion of the abdomen. °· You pass bloody or black tarry stools. °MAKE SURE YOU: °· Understand these instructions. °· Will watch your condition. °· Will get help right away if you are not doing well or get worse. °  °This information is not intended to replace advice given to you by your health care provider. Make sure you discuss  any questions you have with your health care provider. °  °Document Released: 09/19/2005 Document Revised: 08/31/2015 Document Reviewed: 08/19/2013 °Elsevier Interactive Patient Education ©2016 [employer] ° °Nausea and Vomiting °Nausea is a sick feeling that often comes before throwing up (vomiting). Vomiting is a reflex where stomach contents come out of your mouth. Vomiting can cause severe loss of body fluids (dehydration). Children and elderly adults can become dehydrated quickly, especially if they also have diarrhea. Nausea and vomiting are symptoms of a condition or disease. It is important to find the cause of your symptoms. °CAUSES  °· Direct irritation of the stomach lining. This irritation can result from increased acid production (gastroesophageal reflux disease), infection, food poisoning, taking certain medicines (such as nonsteroidal anti-inflammatory drugs), alcohol use, or tobacco use. °· Signals from the brain. These signals could be caused by a headache, heat exposure, an inner ear disturbance, increased pressure in the brain from injury, infection, a tumor, or a concussion, pain, emotional stimulus, or metabolic problems. °· An obstruction in the gastrointestinal tract (bowel obstruction). °· Illnesses such as diabetes, hepatitis, gallbladder problems, appendicitis, kidney problems, cancer, sepsis, atypical symptoms of a heart attack, or eating disorders. °· Medical treatments such as chemotherapy and radiation. °· Receiving medicine that makes you sleep (general anesthetic) during surgery. °DIAGNOSIS °Your caregiver may ask for tests to be done if the problems do not improve after a few days. Tests may also be done if symptoms are severe or if the reason for the   nausea and vomiting is not clear. Tests may include: °· Urine tests. °· Blood tests. °· Stool tests. °· Cultures (to look for evidence of infection). °· X-rays or other imaging studies. °Test results can help your caregiver make  decisions about treatment or the need for additional tests. °TREATMENT °You need to stay well hydrated. Drink frequently but in small amounts. You may wish to drink water, sports drinks, clear broth, or eat frozen ice pops or gelatin dessert to help stay hydrated. When you eat, eating slowly may help prevent nausea. There are also some antinausea medicines that may help prevent nausea. °HOME CARE INSTRUCTIONS  °· Take all medicine as directed by your caregiver. °· If you do not have an appetite, do not force yourself to eat. However, you must continue to drink fluids. °· If you have an appetite, eat a normal diet unless your caregiver tells you differently. °¨ Eat a variety of complex carbohydrates (rice, wheat, potatoes, bread), lean meats, yogurt, fruits, and vegetables. °¨ Avoid high-fat foods because they are more difficult to digest. °· Drink enough water and fluids to keep your urine clear or pale yellow. °· If you are dehydrated, ask your caregiver for specific rehydration instructions. Signs of dehydration may include: °¨ Severe thirst. °¨ Dry lips and mouth. °¨ Dizziness. °¨ Dark urine. °¨ Decreasing urine frequency and amount. °¨ Confusion. °¨ Rapid breathing or pulse. °SEEK IMMEDIATE MEDICAL CARE IF:  °· You have blood or brown flecks (like coffee grounds) in your vomit. °· You have black or bloody stools. °· You have a severe headache or stiff neck. °· You are confused. °· You have severe abdominal pain. °· You have chest pain or trouble breathing. °· You do not urinate at least once every 8 hours. °· You develop cold or clammy skin. °· You continue to vomit for longer than 24 to 48 hours. °· You have a fever. °MAKE SURE YOU:  °· Understand these instructions. °· Will watch your condition. °· Will get help right away if you are not doing well or get worse. °  °This information is not intended to replace advice given to you by your health care provider. Make sure you discuss any questions you have with  your health care provider. °  °Document Released: 12/10/2005 Document Revised: 03/03/2012 Document Reviewed: 05/09/2011 °Elsevier Interactive Patient Education ©2016 [employer]vier [employer]. ° °

## 2016-06-01 LAB — OB RESULTS CONSOLE RUBELLA ANTIBODY, IGM: Rubella: IMMUNE

## 2016-06-01 LAB — OB RESULTS CONSOLE HEPATITIS B SURFACE ANTIGEN: Hepatitis B Surface Ag: NEGATIVE

## 2016-06-01 LAB — OB RESULTS CONSOLE VARICELLA ZOSTER ANTIBODY, IGG: Varicella: IMMUNE

## 2016-10-13 ENCOUNTER — Emergency Department
Admission: EM | Admit: 2016-10-13 | Discharge: 2016-10-13 | Disposition: A | Discharge status: Home / Self Care | Payer: Medicaid Other | Attending: Emergency Medicine | Admitting: Emergency Medicine

## 2016-10-13 ENCOUNTER — Encounter: Payer: Self-pay | Admitting: Emergency Medicine

## 2016-10-13 DIAGNOSIS — O219 Vomiting of pregnancy, unspecified: Secondary | ICD-10-CM | POA: Diagnosis not present

## 2016-10-13 DIAGNOSIS — Z87891 Personal history of nicotine dependence: Secondary | ICD-10-CM | POA: Diagnosis not present

## 2016-10-13 DIAGNOSIS — R42 Dizziness and giddiness: Secondary | ICD-10-CM | POA: Diagnosis not present

## 2016-10-13 DIAGNOSIS — J45909 Unspecified asthma, uncomplicated: Secondary | ICD-10-CM | POA: Diagnosis not present

## 2016-10-13 DIAGNOSIS — Z3A27 27 weeks gestation of pregnancy: Secondary | ICD-10-CM | POA: Diagnosis not present

## 2016-10-13 DIAGNOSIS — O99353 Diseases of the nervous system complicating pregnancy, third trimester: Secondary | ICD-10-CM | POA: Diagnosis present

## 2016-10-13 LAB — CBC
HEMATOCRIT: 32.8 % — AB (ref 35.0–47.0)
HEMOGLOBIN: 10.9 g/dL — AB (ref 12.0–16.0)
MCH: 26.7 pg (ref 26.0–34.0)
MCHC: 33.4 g/dL (ref 32.0–36.0)
MCV: 79.9 fL — AB (ref 80.0–100.0)
Platelets: 219 10*3/uL (ref 150–440)
RBC: 4.11 MIL/uL (ref 3.80–5.20)
RDW: 16.1 % — ABNORMAL HIGH (ref 11.5–14.5)
WBC: 10.9 10*3/uL (ref 3.6–11.0)

## 2016-10-13 LAB — BASIC METABOLIC PANEL
ANION GAP: 7 (ref 5–15)
BUN: 6 mg/dL (ref 6–20)
CHLORIDE: 108 mmol/L (ref 101–111)
CO2: 21 mmol/L — AB (ref 22–32)
Calcium: 8.8 mg/dL — ABNORMAL LOW (ref 8.9–10.3)
Creatinine, Ser: 0.38 mg/dL — ABNORMAL LOW (ref 0.44–1.00)
GFR calc Af Amer: >60 mL/min (ref >60)
GLUCOSE: 90 mg/dL (ref 65–99)
POTASSIUM: 3.5 mmol/L (ref 3.5–5.1)
Sodium: 136 mmol/L (ref 135–145)

## 2016-10-13 LAB — URINALYSIS COMPLETE WITH MICROSCOPIC (ARMC ONLY)
Bilirubin Urine: NEGATIVE
Glucose, UA: NEGATIVE mg/dL
HGB URINE DIPSTICK: NEGATIVE
Leukocytes, UA: NEGATIVE
NITRITE: NEGATIVE
PROTEIN: NEGATIVE mg/dL
SPECIFIC GRAVITY, URINE: 1.016 (ref 1.005–1.030)
pH: 6 (ref 5.0–8.0)

## 2016-10-13 MED ORDER — ONDANSETRON HCL 4 MG/2ML IJ SOLN
4.0000 mg | Freq: Once | INTRAMUSCULAR | Status: AC
  Administered 2016-10-13: 4 mg via INTRAVENOUS
  Filled 2016-10-13: qty 2

## 2016-10-13 MED ORDER — MECLIZINE HCL 25 MG PO TABS: 25.0000 mg | ORAL_TABLET | Freq: Three times a day (TID) | ORAL | 0 refills | Status: DC | PRN

## 2016-10-13 MED ORDER — ONDANSETRON HCL 4 MG PO TABS: 4.0000 mg | ORAL_TABLET | Freq: Three times a day (TID) | ORAL | 0 refills | Status: DC | PRN

## 2016-10-13 MED ORDER — MECLIZINE HCL 25 MG PO TABS
25.0000 mg | ORAL_TABLET | Freq: Once | ORAL | Status: AC
  Administered 2016-10-13: 25 mg via ORAL
  Filled 2016-10-13: qty 1

## 2016-10-13 NOTE — ED Notes (Signed)
Patient states she feels like she has drank enough water today, but does c/o lips being dry.

## 2016-10-13 NOTE — Discharge Instructions (Signed)
Please return immediately if condition worsens. Please contact her primary physician or the physician you were given for referral. If you have any specialist physicians involved in her treatment and plan please also contact them. Thank you for using [facility]. ° °

## 2016-10-13 NOTE — ED Provider Notes (Signed)
Time Seen: Approximately 2014  I have reviewed the triage notes  Chief Complaint: Dizziness and Emesis   History of Present Illness: [name] is a 20 y.o. female *who presents with the above gravida 3. 2 currently [redacted] weeks pregnant. Patient states she had acute onset of feeling "" dizzy "". Patient had some persistent nausea and vomiting associated with her dizziness which she describes in a vertigo type description with the room spinning and a counterclockwise direction. She states the symptoms are worse anytime she moves and if she stays still he seemed to improve. He denies any new ear pain or deafness. She denies any ringing in her ears. She still has continued to feel fetal movements and denies any abdominal pain or vaginal bleeding.  Past Medical History:  Diagnosis Date  . Asthma    exercise induced  . Chronic fatigue   . Fibromyalgia   . Hyperthyroidism     Patient Active Problem List   Diagnosis Date Noted  . Normal labor and delivery 12/18/2013  . Vaginal delivery 12/18/2013  . Smoker 12/06/2013  . Migraine, unspecified, without mention of intractable migraine without mention of status migrainosus 12/06/2013  . Hx of pyelonephritis--2013 12/06/2013  . Hearing loss in right ear 12/06/2013  . Asthma, chronic--exercise induced 12/06/2013  . RBC abnormality--small RBCs, dx at [facility], told to take Fe. 12/06/2013  . Fibromyalgia/chronic fatigue 12/06/2013  . Allergy history, drug--prednisone 12/06/2013  . ABDOMINAL PAIN RIGHT LOWER QUADRANT 01/20/2008  . CONSTIPATION, HX OF 01/20/2008    Past Surgical History:  Procedure Laterality Date  . NO PAST SURGERIES    . UPPER GASTROINTESTINAL ENDOSCOPY      Past Surgical History:  Procedure Laterality Date  . NO PAST SURGERIES    . UPPER GASTROINTESTINAL ENDOSCOPY      Current Outpatient Rx  . Order #: [id number] Class: Historical Med  . Order #: [id number] Class: Print  . Order #: [id number] Class: Print  . Order  #: [id number] Class: Print    Allergies:  Other; Phenergan [promethazine hcl]; Prednisone; and Vicodin [hydrocodone-acetaminophen]  Family History: Family History  Problem Relation Age of Onset  . Hypertension Mother   . Hyperlipidemia Father   . Hypertension Father   . Heart disease Sister   . Asthma Sister   . Multiple sclerosis Paternal Aunt   . Hypertension Maternal Grandmother   . Cancer Maternal Grandmother   . Dementia Maternal Grandmother     Social History: Social History  Substance Use Topics  . Smoking status: Former Smoker    Packs/day: 0.25    Types: Cigarettes    Quit date: 10/24/2014  . Smokeless tobacco: Never Used  . Alcohol use No     Review of Systems:   10 point review of systems was performed and was otherwise negative:  Constitutional: No fever Eyes: No visual disturbances ENT: No sore throat, ear pain Cardiac: No chest pain Respiratory: No shortness of breath, wheezing, or stridor Abdomen: No abdominal pain, no vomiting, No diarrhea Endocrine: No weight loss, No night sweats Extremities: No peripheral edema, cyanosis Skin: No rashes, easy bruising Neurologic: No focal weakness, trouble with speech or swollowing Urologic: No dysuria, Hematuria, or urinary frequency   Physical Exam:  ED Triage Vitals [10/13/16 1948]  Enc Vitals Group     BP 113/64     Pulse Rate 97     Resp 18     Temp 97.8 F (36.6 C)     Temp Source Oral     SpO2  96 %     Weight 234 lb (106.1 kg)     Height 5\' 7"  (1.702 m)     Head Circumference      Peak Flow      Pain Score      Pain Loc      Pain Edu?      Excl. in GC?     General: Awake , Alert , and Oriented times 3; GCS 15 Head: Normal cephalic , atraumatic Eyes: Pupils equal , round, reactive to light. Mild 3 beat lateral nystagmus which is exhaustive Nose/Throat: No nasal drainage, patent upper airway without erythema or exudate. No erythema or abnormalities noted in the external ear canal Neck:  Supple, Full range of motion, No anterior adenopathy or palpable thyroid masses Lungs: Clear to ascultation without wheezes , rhonchi, or rales Heart: Regular rate, regular rhythm without murmurs , gallops , or rubs Abdomen: Soft, non tender without rebound, guarding , or rigidity; bowel sounds positive and symmetric in all 4 quadrants. No organomegaly .    Leopold maneuvers at approximately 8 cm above the umbilicus    Extremities: 2 plus symmetric pulses. No edema, clubbing or cyanosis Neurologic: normal ambulation after medications, Motor symmetric without deficits, sensory intact Skin: warm, dry, no rashes   Labs:   All laboratory work was reviewed including any pertinent negatives or positives listed below:  Labs Reviewed  BASIC METABOLIC PANEL - Abnormal; Notable for the following:       Result Value   CO2 21 (*)    Creatinine, Ser 0.38 (*)    Calcium 8.8 (*)    All other components within normal limits  CBC - Abnormal; Notable for the following:    Hemoglobin 10.9 (*)    HCT 32.8 (*)    MCV 79.9 (*)    RDW 16.1 (*)    All other components within normal limits  URINALYSIS COMPLETEWITH MICROSCOPIC ([facility] ONLY) - Abnormal; Notable for the following:    Color, Urine YELLOW (*)    APPearance CLEAR (*)    Ketones, ur 2+ (*)    Bacteria, UA RARE (*)    Squamous Epithelial / LPF 0-5 (*)    All other components within normal limits  CBG MONITORING, ED  Laboratory work is normal for the patient and her third trimester pregnancy  EKG: ED ECG REPORT I, Jennye Moccasin, the attending physician, personally viewed and interpreted this ECG.  Date: 10/13/2016 EKG Time: 1956 Rate: 90 Rhythm: normal sinus rhythm QRS Axis: normal Intervals: normal ST/T Wave abnormalities: normal Conduction Disturbances: none Narrative Interpretation: unremarkable    ED Course: * Patient's stay here was uneventful and she had symptomatic relief with IV Zofran and meclizine by mouth. The patient's  presentation is consistent with peripheral vertigo. I felt further imaging was not necessary. Her fetal movements were 138 bpm and she doesn't have any labor type issues and I felt she did not need to be seen in the L&D unit. Patient appears comfortable with this decision and was discharged with similar medications for home usage. Clinical Course     Assessment:Acute peripheral vertigo   Final Clinical Impression:   Final diagnoses:  Vertigo     Plan: * Outpatient " Discharge Medication List as of 10/13/2016 10:15 PM    START taking these medications   Details  meclizine (ANTIVERT) 25 MG tablet Take 1 tablet (25 mg total) by mouth 3 (three) times daily as needed for dizziness or nausea., Starting Sat 10/13/2016, Print  ondansetron (ZOFRAN) 4 MG tablet Take 1 tablet (4 mg total) by mouth every 8 (eight) hours as needed for nausea or vomiting., Starting Sat 10/13/2016, Print      " Patient was advised to return immediately if condition worsens. Patient was advised to follow up with their primary care physician or other specialized physicians involved in their outpatient care. The patient and/or family member/power of attorney had laboratory results reviewed at the bedside. All questions and concerns were addressed and appropriate discharge instructions were distributed by the nursing staff.             Jennye MoccasinBrian S Vila Dory, MD 10/13/16 2352

## 2016-10-13 NOTE — ED Notes (Addendum)
Pt presents with dizziness, nausea and vomiting; pt is 26 +6 days pregnant; G3P2; called upstairs to labor and delivery and was informed midwife Brothers wants pt evaluated here first and then taken upstairs for fetal monitoring if needed; pt says she has had no complications this pregnancy; positive fetal movement, actually more than normal;

## 2016-10-13 NOTE — ED Notes (Signed)
Patient reports improvement in dizziness and no nausea when in an upright position.

## 2016-10-13 NOTE — ED Triage Notes (Signed)
Patient states that she about [redacted] weeks pregnant and became dizzy and started vomiting around 17:00 today.

## 2016-10-13 NOTE — ED Notes (Signed)
Call made to L&D floor to update on patient's status and was told that patient could be discharged home upon ED MD's discretion.

## 2016-10-25 LAB — OB RESULTS CONSOLE RPR: RPR: NONREACTIVE

## 2016-10-25 LAB — OB RESULTS CONSOLE HIV ANTIBODY (ROUTINE TESTING): HIV: NONREACTIVE

## 2016-10-26 ENCOUNTER — Emergency Department (HOSPITAL_COMMUNITY): Payer: No Typology Code available for payment source

## 2016-10-26 ENCOUNTER — Emergency Department (HOSPITAL_COMMUNITY)
Admission: EM | Admit: 2016-10-26 | Discharge: 2016-10-27 | Disposition: A | Discharge status: Other Acute Inpatient Hospital | Payer: No Typology Code available for payment source | Attending: Emergency Medicine | Admitting: Emergency Medicine

## 2016-10-26 ENCOUNTER — Encounter: Payer: Self-pay | Admitting: Emergency Medicine

## 2016-10-26 DIAGNOSIS — S0081XA Abrasion of other part of head, initial encounter: Secondary | ICD-10-CM | POA: Insufficient documentation

## 2016-10-26 DIAGNOSIS — S161XXA Strain of muscle, fascia and tendon at neck level, initial encounter: Secondary | ICD-10-CM | POA: Diagnosis not present

## 2016-10-26 DIAGNOSIS — O99513 Diseases of the respiratory system complicating pregnancy, third trimester: Secondary | ICD-10-CM | POA: Diagnosis not present

## 2016-10-26 DIAGNOSIS — S59911A Unspecified injury of right forearm, initial encounter: Secondary | ICD-10-CM | POA: Diagnosis not present

## 2016-10-26 DIAGNOSIS — Y92410 Unspecified street and highway as the place of occurrence of the external cause: Secondary | ICD-10-CM | POA: Diagnosis not present

## 2016-10-26 DIAGNOSIS — O9A213 Injury, poisoning and certain other consequences of external causes complicating pregnancy, third trimester: Secondary | ICD-10-CM | POA: Diagnosis present

## 2016-10-26 DIAGNOSIS — R1030 Lower abdominal pain, unspecified: Secondary | ICD-10-CM | POA: Diagnosis not present

## 2016-10-26 DIAGNOSIS — O26893 Other specified pregnancy related conditions, third trimester: Secondary | ICD-10-CM | POA: Diagnosis not present

## 2016-10-26 DIAGNOSIS — Y939 Activity, unspecified: Secondary | ICD-10-CM | POA: Insufficient documentation

## 2016-10-26 DIAGNOSIS — Z3A28 28 weeks gestation of pregnancy: Secondary | ICD-10-CM | POA: Diagnosis not present

## 2016-10-26 DIAGNOSIS — Y999 Unspecified external cause status: Secondary | ICD-10-CM | POA: Diagnosis not present

## 2016-10-26 DIAGNOSIS — Z87891 Personal history of nicotine dependence: Secondary | ICD-10-CM | POA: Insufficient documentation

## 2016-10-26 LAB — CBC WITH DIFFERENTIAL/PLATELET
Basophils Absolute: 0 10*3/uL (ref 0.0–0.1)
Basophils Relative: 0 %
EOS ABS: 0.1 10*3/uL (ref 0.0–0.7)
Eosinophils Relative: 1 %
HCT: 31.9 % — ABNORMAL LOW (ref 36.0–46.0)
HEMOGLOBIN: 10.2 g/dL — AB (ref 12.0–15.0)
LYMPHS ABS: 1.7 10*3/uL (ref 0.7–4.0)
LYMPHS PCT: 16 %
MCH: 25.5 pg — AB (ref 26.0–34.0)
MCHC: 32 g/dL (ref 30.0–36.0)
MCV: 79.8 fL (ref 78.0–100.0)
MONOS PCT: 4 %
Monocytes Absolute: 0.5 10*3/uL (ref 0.1–1.0)
NEUTROS PCT: 79 %
Neutro Abs: 8.7 10*3/uL — ABNORMAL HIGH (ref 1.7–7.7)
Platelets: 251 10*3/uL (ref 150–400)
RBC: 4 MIL/uL (ref 3.87–5.11)
RDW: 15.3 % (ref 11.5–15.5)
WBC: 10.9 10*3/uL — ABNORMAL HIGH (ref 4.0–10.5)

## 2016-10-26 LAB — BASIC METABOLIC PANEL
Anion gap: 9 (ref 5–15)
BUN: 5 mg/dL — AB (ref 6–20)
CHLORIDE: 107 mmol/L (ref 101–111)
CO2: 20 mmol/L — AB (ref 22–32)
CREATININE: 0.41 mg/dL — AB (ref 0.44–1.00)
Calcium: 9 mg/dL (ref 8.9–10.3)
GFR calc Af Amer: >60 mL/min (ref >60)
GFR calc non Af Amer: >60 mL/min (ref >60)
GLUCOSE: 89 mg/dL (ref 65–99)
POTASSIUM: 3.5 mmol/L (ref 3.5–5.1)
SODIUM: 136 mmol/L (ref 135–145)
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
## 2016-10-27 DIAGNOSIS — Z3A28 28 weeks gestation of pregnancy: Secondary | ICD-10-CM

## 2016-10-27 DIAGNOSIS — O9A213 Injury, poisoning and certain other consequences of external causes complicating pregnancy, third trimester: Secondary | ICD-10-CM | POA: Diagnosis not present

## 2016-10-27 MED ORDER — CYCLOBENZAPRINE HCL 10 MG PO TABS: 10.0000 mg | ORAL_TABLET | Freq: Three times a day (TID) | ORAL | 0 refills | Status: AC | PRN

## 2016-10-27 NOTE — Discharge Instructions (Signed)
What Do I Need to Know About Injuries During Pregnancy? °Trauma is the most common cause of injury and death in pregnant women. This can also result in significant harm or death of the baby. °Your baby is protected in the womb (uterus) by a sac filled with fluid (amniotic sac). Your baby can be harmed if there is direct, high-impact trauma to your abdomen and pelvis. This type of trauma can result in tearing of your uterus, the placenta pulling away from the wall of the uterus (placenta abruption), or the amniotic sac breaking open (rupture of membranes). These injuries can decrease or stop the blood supply to your baby or cause you to go into labor earlier than expected. Minor falls and low-impact automobile accidents do not usually harm your baby, even if they do minimally harm you. °WHAT KIND OF INJURIES CAN AFFECT MY PREGNANCY? °The most common causes of injury or death to a baby include: °· Falls. Falls are more common in the second and third trimester of the pregnancy. Factors that increase your risk of falling include: °¨ Increase in your weight. °¨ The change in your center of gravity. °¨ Tripping over an object that cannot be seen. °¨ Increased looseness (laxity) of your ligaments resulting in less coordinated movements (you may feel clumsy). °¨ Falling during high-risk activities like horseback riding or skiing. °· Automobile accidents. It is important to wear your seat belt properly, with the lap belt below your abdomen, and always practice safe driving. °· Domestic violence or assault. °· Burns (fire or electrical). °The most common causes of injury or death to the pregnant woman include: °· Injuries that cause severe bleeding, shock, and loss of blood flow to major organs. °· Head and neck injuries that result in severe brain or spinal damage. °· Chest trauma that can cause direct injury to the heart and lungs or any injury that affects the area enclosed by the ribs. Trauma to this area can result in  cardiorespiratory arrest. °WHAT CAN I DO TO PROTECT MYSELF AND MY BABY FROM INJURY WHILE I AM PREGNANT? °· Remove slippery rugs and loose objects on the floor that increase your risk of tripping. °· Avoid walking on wet or slippery floors. °· Wear comfortable shoes that have a good grip on the sole. Do not wear high-heeled shoes. °· Always wear your seat belt properly, with the lap belt below your abdomen, and always practice safe driving. Do not ride on a motorcycle while pregnant. °· Do not participate in high-impact activities or sports. °· Avoid fires, starting fires, lifting heavy pots of boiling or hot liquids, and fixing electrical problems. °· Only take over-the-counter or prescription medicines for pain, fever, or discomfort as directed by your health care provider. °· Know your blood type and the father's blood type in case you develop vaginal bleeding or experience an injury for which a blood transfusion may be necessary. °· Call your local emergency services (911 in the [place] if you are a victim of domestic violence or assault. Spousal abuse can be a significant cause of trauma during pregnancy. For help and support, contact the National Domestic Violence Hotline. °WHEN SHOULD I SEEK IMMEDIATE MEDICAL CARE?  °· You fall on your abdomen or experience any high-force accident or injury. °· You have been assaulted (domestic or otherwise). °· You have been in a car accident. °· You develop vaginal bleeding. °· You develop fluid leaking from the vagina. °· You develop uterine contractions (pelvic cramping, pain, or significant low back   pain). °· You become weak or faint, or have uncontrolled vomiting after trauma. °· You had a serious burn. This includes burns to the face, neck, hands, or genitals, or burns greater than the size of your palm anywhere else. °· You develop neck stiffness or pain after a fall or from other trauma. °· You develop a headache or vision problems after a fall or from other  trauma. °· You do not feel the baby moving or the baby is not moving as much as before a fall or other trauma. °  °This information is not intended to replace advice given to you by your health care provider. Make sure you discuss any questions you have with your health care provider. °  °Document Released: 01/17/2005 Document Revised: 12/31/2014 Document Reviewed: 09/16/2013 °Elsevier Interactive Patient Education ©2016 [employer] Inc. ° °
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
## 2016-12-14 LAB — OB RESULTS CONSOLE GBS: GBS: NEGATIVE

## 2016-12-14 LAB — OB RESULTS CONSOLE GC/CHLAMYDIA
CHLAMYDIA, DNA PROBE: NEGATIVE
Gonorrhea: NEGATIVE

## 2016-12-23 ENCOUNTER — Inpatient Hospital Stay
Admission: EM | Admit: 2016-12-23 | Discharge: 2016-12-24 | Disposition: A | Discharge status: Home / Self Care | Payer: Medicaid Other | Attending: Obstetrics and Gynecology | Admitting: Obstetrics and Gynecology

## 2016-12-23 DIAGNOSIS — O4703 False labor before 37 completed weeks of gestation, third trimester: Secondary | ICD-10-CM | POA: Diagnosis present

## 2016-12-23 DIAGNOSIS — Z3A36 36 weeks gestation of pregnancy: Secondary | ICD-10-CM | POA: Diagnosis not present

## 2016-12-23 NOTE — OB Triage Note (Signed)
Patient arrived with complaints of contractions every 1-3 minutes apart.  No leaking of fluids or vaginal bleeding.   Reports good fetal movement.  EFM and toco applied.  Plan to assess for labor.

## 2016-12-24 DIAGNOSIS — O4703 False labor before 37 completed weeks of gestation, third trimester: Secondary | ICD-10-CM | POA: Diagnosis not present

## 2016-12-24 NOTE — Discharge Instructions (Signed)
Return or call Birthplace with contractions of increasing intensity, leaking fluid, vaginal bleeding or decreased fetal movement.

## 2016-12-24 NOTE — Final Progress Note (Signed)
Physician Final Progress Note  Patient ID: [name] MRN: [id number] DOB/AGE: [birth year] 20 y.o.  Admit date: 12/23/2016 Admitting provider: Conard NovakStephen D Jackson, MD Discharge date: 12/24/2016   Admission Diagnoses: IUP at 36.6 weeks with contractions  Discharge Diagnoses:  IUP at 37 weeks with false labor  Consults:   Significant Findings/ Diagnostic Studies: 21 yo g3 P1102 with EDC=01/14/2017 presented at 36.6 weeks with complaints of contractions q1-3 min apart. Has been having contractions most of the day. Has to breath thru some of the contractions, but mostly feeling pressure and tightening with the contractions. No LOF or vaginal bleeding. Baby active. History of SVD x2 at 36 and 37 weeks. [facility] also [facility] for [facility]. Exam: Temp 97.7 F (36.5 C) (Oral)   Resp 16   LMP 04/09/2016  126/73 FHR 125-130 with prolonged accelerations to 180s with active fetal movement, moderate variability Toco: Irregular contractions, spaced out after arrival Cervix: closed per RN exam  A: IUP at 37 weeks with false labor FWB- Cat 1  P: DC home with labor precautions  Procedures: none  Discharge Condition: stable  Disposition: 02-Transferred to [facility]  Diet: Regular diet  Discharge Activity: Activity as tolerated   Follow-up Information    [facility] Follow up on 12/28/2016.   Contact information: [address] [place] [address] [phone]           Patient was triaged by the labor and delivery RN in consultation with myself. FHR strip reviewed.  SignedFarrel Conners: [name] 12/24/2016, 12:33 AM

## 2016-12-24 NOTE — L&D Delivery Note (Signed)
Delivery Note Primary OB: [facility] Delivery Physician: Annamarie MajorPaul Harris, MD Gestational Age: Full term Antepartum complications: none Intrapartum complications: None  A viable Female was delivered via vertex perentation.  Apgars:8 ,9  Weight:  pending .   Placenta status: spontaneous and Intact.  Cord: 3+ vessels;  with the following complications: none.  Anesthesia:  epidural Episiotomy:  none Lacerations:  none Suture Repair: none Est. Blood Loss (mL):  less than 100 mL  Mom to postpartum.  Baby to Couplet care / Skin to Skin.  Annamarie MajorPaul Harris, MD Dept of OB/GYN [phone]

## 2017-01-08 ENCOUNTER — Observation Stay
Admission: EM | Admit: 2017-01-08 | Discharge: 2017-01-08 | Disposition: A | Discharge status: Home / Self Care | Payer: Medicaid Other | Source: Home / Self Care | Admitting: Obstetrics and Gynecology

## 2017-01-08 DIAGNOSIS — O368130 Decreased fetal movements, third trimester, not applicable or unspecified: Secondary | ICD-10-CM | POA: Insufficient documentation

## 2017-01-08 DIAGNOSIS — Z91018 Allergy to other foods: Secondary | ICD-10-CM | POA: Insufficient documentation

## 2017-01-08 DIAGNOSIS — Z888 Allergy status to other drugs, medicaments and biological substances status: Secondary | ICD-10-CM | POA: Insufficient documentation

## 2017-01-08 DIAGNOSIS — M797 Fibromyalgia: Secondary | ICD-10-CM

## 2017-01-08 DIAGNOSIS — J45909 Unspecified asthma, uncomplicated: Secondary | ICD-10-CM

## 2017-01-08 DIAGNOSIS — Z87891 Personal history of nicotine dependence: Secondary | ICD-10-CM | POA: Insufficient documentation

## 2017-01-08 DIAGNOSIS — Z3493 Encounter for supervision of normal pregnancy, unspecified, third trimester: Secondary | ICD-10-CM | POA: Insufficient documentation

## 2017-01-08 DIAGNOSIS — Z3A39 39 weeks gestation of pregnancy: Secondary | ICD-10-CM

## 2017-01-08 DIAGNOSIS — O99283 Endocrine, nutritional and metabolic diseases complicating pregnancy, third trimester: Secondary | ICD-10-CM | POA: Insufficient documentation

## 2017-01-08 DIAGNOSIS — Z885 Allergy status to narcotic agent status: Secondary | ICD-10-CM

## 2017-01-08 DIAGNOSIS — O99513 Diseases of the respiratory system complicating pregnancy, third trimester: Secondary | ICD-10-CM

## 2017-01-08 DIAGNOSIS — R5382 Chronic fatigue, unspecified: Secondary | ICD-10-CM | POA: Insufficient documentation

## 2017-01-08 DIAGNOSIS — O368190 Decreased fetal movements, unspecified trimester, not applicable or unspecified: Secondary | ICD-10-CM | POA: Diagnosis present

## 2017-01-08 DIAGNOSIS — E0590 Thyrotoxicosis, unspecified without thyrotoxic crisis or storm: Secondary | ICD-10-CM | POA: Insufficient documentation

## 2017-01-08 NOTE — OB Triage Note (Signed)
Patient came in for observation for lower abdominal pain and decreased fetal movement since 0210. Patient rates pain 4 out of 10.Patient reports irregular contractions for the past 2 weeks. Patient states she has had a cough and runny nose since yesterday from a cold she got from her children. Patient denies leaking of fluid, vaginal bleeding and spotting. Vital signs stable and patient afebrile. FHR baseline 135 with moderate variability with accelerations 15 x 15 and no decelerations. Family at bedside. Will continue to monitor.

## 2017-01-08 NOTE — Discharge Instructions (Signed)
Call provider or return to birthplace with: ° °1. Strong regular contractions every 5 minutes. °2. Leaking of fluid from your vagina °3. Vaginal bleeding: Bright red or heavy like a period °4. Decreased Fetal movement ° °

## 2017-01-08 NOTE — Discharge Summary (Signed)
Patient discharged with instructions on follow up appointment, fetal kick count, labor precautions, and when to seek medical attention. Patient ambulatory at discharge with steady gait and no complaints. Patient discharged with family.

## 2017-01-08 NOTE — OB Triage Note (Signed)
Pt presents to L&D with c/o contractions q2-6 min since 1915, Denies vaginal bleeding or leaking fluid. Reports good fetal movement. EFM and toco applied and explained. Plan to evaluate for labor and monitor fetal and maternal well being

## 2017-01-08 NOTE — Discharge Summary (Signed)
Physician Final Progress Note  Patient ID: [name] MRN: [id number] DOB/AGE: October 22, 1996 20 y.o.  Admit date: 01/08/2017 Admitting provider: Tresea MallJane Toriano Aikey, CNM Discharge date: 01/08/2017   Admission Diagnoses: G3P2002 at 7161w1d with c/o contractions today that have been every 5-6 minutes. Pt admits positive fetal movement. She denies LOF, VB.   Discharge Diagnoses:  Active Problems:   Indication for care in labor and delivery, antepartum IUP at 2561w1d with reactive NST, not in labor  History of Present Illness: The patient was admitted for observation, placed on monitors, cervical exam  Past Medical History:  Diagnosis Date  . Asthma    exercise induced  . Chronic fatigue   . Fibromyalgia   . Hyperthyroidism     Past Surgical History:  Procedure Laterality Date  . NO PAST SURGERIES    . UPPER GASTROINTESTINAL ENDOSCOPY      No current facility-administered medications on file prior to encounter.    Current Outpatient Prescriptions on File Prior to Encounter  Medication Sig Dispense Refill  . acetaminophen (TYLENOL) 325 MG tablet Take 325-650 mg by mouth every 6 (six) hours as needed for mild pain or headache.    . cyclobenzaprine (FLEXERIL) 10 MG tablet Take 1 tablet (10 mg total) by mouth 3 (three) times daily as needed for muscle spasms. 30 tablet 0  . ferrous sulfate 325 (65 FE) MG EC tablet Take 325 mg by mouth 1 day or 1 dose.      Allergies  Allergen Reactions  . Other Nausea And Vomiting and Other (See Comments)    Blue Cheese causes sweating nausea and vomitting  . Phenergan [Promethazine Hcl]   . Prednisone Other (See Comments)    Severe Depression  . Vicodin [Hydrocodone-Acetaminophen] Nausea And Vomiting    Social History   Social History  . Marital status: Single    Spouse name: N/A  . Number of children: N/A  . Years of education: N/A   Occupational History  . Not on file.   Social History Main Topics  . Smoking status: Former Smoker     Packs/day: 0.25    Types: Cigarettes    Quit date: 10/24/2014  . Smokeless tobacco: Never Used  . Alcohol use No  . Drug use: No  . Sexual activity: Not Currently     Comment: last intercourse two months ago   Other Topics Concern  . Not on file   Social History Narrative  . No narrative on file    Physical Exam: Temp 98.5 F (36.9 C) (Oral)   Resp 18   Ht 5\' 7"  (1.702 m)   Wt 235 lb (106.6 kg)   LMP 04/09/2016   BMI 36.81 kg/m   Gen: NAD CV: RRR Pulm: CTAB Pelvic: FT/thick/posterior on admission and no change prior to discharge per RN S. Apel Toco: q 6-8 minutes Fetal Well Being: 140 bpm, moderate variability, +accelerations, -decelerations Ext: no evidence of DVT  Consults: None  Significant Findings/ Diagnostic Studies: none  Procedures: NST  Discharge Condition: good  Disposition: 01-Home or Self Care  Diet: Regular diet  Discharge Activity: Activity as tolerated  Discharge Instructions    Discharge activity:  No Restrictions    Complete by:  As directed    Discharge diet:  No restrictions    Complete by:  As directed    Fetal Kick Count:  Lie on our left side for one hour after a meal, and count the number of times your baby kicks.  If it is  less than 5 times, get up, move around and drink some juice.  Repeat the test 30 minutes later.  If it is still less than 5 kicks in an hour, notify your doctor.    Complete by:  As directed    LABOR:  When conractions begin, you should start to time them from the beginning of one contraction to the beginning  of the next.  When contractions are 5 - 10 minutes apart or less and have been regular for at least an hour, you should call your health care provider.    Complete by:  As directed    No sexual activity restrictions    Complete by:  As directed    Notify physician for bleeding from the vagina    Complete by:  As directed    Notify physician for blurring of vision or spots before the eyes    Complete by:   As directed    Notify physician for chills or fever    Complete by:  As directed    Notify physician for fainting spells, "black outs" or loss of consciousness    Complete by:  As directed    Notify physician for increase in vaginal discharge    Complete by:  As directed    Notify physician for leaking of fluid    Complete by:  As directed    Notify physician for pain or burning when urinating    Complete by:  As directed    Notify physician for pelvic pressure (sudden increase)    Complete by:  As directed    Notify physician for severe or continued nausea or vomiting    Complete by:  As directed    Notify physician for sudden gushing of fluid from the vagina (with or without continued leaking)    Complete by:  As directed    Notify physician for sudden, constant, or occasional abdominal pain    Complete by:  As directed    Notify physician if baby moving less than usual    Complete by:  As directed      Allergies as of 01/08/2017      Reactions   Other Nausea And Vomiting, Other (See Comments)   Blue Cheese causes sweating nausea and vomitting   Phenergan [promethazine Hcl]    Prednisone Other (See Comments)   Severe Depression   Vicodin [hydrocodone-acetaminophen] Nausea And Vomiting      Medication List    TAKE these medications   acetaminophen 325 MG tablet Commonly known as:  TYLENOL Take 325-650 mg by mouth every 6 (six) hours as needed for mild pain or headache.   cyclobenzaprine 10 MG tablet Commonly known as:  FLEXERIL Take 1 tablet (10 mg total) by mouth 3 (three) times daily as needed for muscle spasms.   ferrous sulfate 325 (65 FE) MG EC tablet Take 325 mg by mouth 1 day or 1 dose.      Follow-up Information    [facility] Follow up.   Why:  go to regular scheduled prenatal appointment Contact information: [address] [place] [address] [phone]           Total time spent taking care of this patient: 15  minutes  Signed: Obie Dredge  01/08/2017, 10:42 PM

## 2017-01-08 NOTE — Final Progress Note (Signed)
Physician Final Progress Note  Patient ID: [name] MRN: [id number] DOB/AGE: 04/14/1996 20 y.o.  Admit date: 01/08/2017 Admitting provider: Vena Austria, MD Discharge date: 01/08/2017   Admission Diagnoses: Decreased fetal movement, irregular contractions  Discharge Diagnoses:  Active Problems:   Decreased fetal movement   Irregular contractions  20 yo G3P2002 at [redacted]w[redacted]d presenting with decreased fetal movement and irregular contractions.  Cervix ftp on presentation  Consults: None  Significant Findings/ Diagnostic Studies: none  Procedures: NST 130, moderate, +accels, no decels reactive category I tracing and tocometer with irregular contractions q74min  Discharge Condition: good  Disposition: 01-Home or Self Care  Diet: Regular diet  Discharge Activity: Activity as tolerated  Discharge Instructions    Discharge activity:  No Restrictions    Complete by:  As directed    Discharge diet:  No restrictions    Complete by:  As directed    Fetal Kick Count:  Lie on our left side for one hour after a meal, and count the number of times your baby kicks.  If it is less than 5 times, get up, move around and drink some juice.  Repeat the test 30 minutes later.  If it is still less than 5 kicks in an hour, notify your doctor.    Complete by:  As directed    LABOR:  When conractions begin, you should start to time them from the beginning of one contraction to the beginning  of the next.  When contractions are 5 - 10 minutes apart or less and have been regular for at least an hour, you should call your health care provider.    Complete by:  As directed    No sexual activity restrictions    Complete by:  As directed    Notify physician for bleeding from the vagina    Complete by:  As directed    Notify physician for blurring of vision or spots before the eyes    Complete by:  As directed    Notify physician for chills or fever    Complete by:  As directed    Notify  physician for fainting spells, "black outs" or loss of consciousness    Complete by:  As directed    Notify physician for increase in vaginal discharge    Complete by:  As directed    Notify physician for leaking of fluid    Complete by:  As directed    Notify physician for pain or burning when urinating    Complete by:  As directed    Notify physician for pelvic pressure (sudden increase)    Complete by:  As directed    Notify physician for severe or continued nausea or vomiting    Complete by:  As directed    Notify physician for sudden gushing of fluid from the vagina (with or without continued leaking)    Complete by:  As directed    Notify physician for sudden, constant, or occasional abdominal pain    Complete by:  As directed    Notify physician if baby moving less than usual    Complete by:  As directed      Allergies as of 01/08/2017      Reactions   Other Nausea And Vomiting, Other (See Comments)   Blue Cheese causes sweating nausea and vomitting   Phenergan [promethazine Hcl]    Prednisone Other (See Comments)   Severe Depression   Vicodin [hydrocodone-acetaminophen] Nausea And Vomiting  Medication List    TAKE these medications   acetaminophen 325 MG tablet Commonly known as:  TYLENOL Take 325-650 mg by mouth every 6 (six) hours as needed for mild pain or headache.   cyclobenzaprine 10 MG tablet Commonly known as:  FLEXERIL Take 1 tablet (10 mg total) by mouth 3 (three) times daily as needed for muscle spasms.   ferrous sulfate 325 (65 FE) MG EC tablet Take 325 mg by mouth 1 day or 1 dose.        Total time spent taking care of this patient: 20 minutes  Signed: Lorrene ReidSTAEBLER, Tariah Transue M 01/08/2017, 4:13 AM

## 2017-01-08 NOTE — Discharge Summary (Signed)
See final progress note. 

## 2017-01-09 ENCOUNTER — Inpatient Hospital Stay
Admission: EM | Admit: 2017-01-09 | Discharge: 2017-01-11 | DRG: 775 | Disposition: A | Discharge status: Home / Self Care | Payer: Medicaid Other | Attending: Certified Nurse Midwife | Admitting: Certified Nurse Midwife

## 2017-01-09 ENCOUNTER — Inpatient Hospital Stay: Payer: Medicaid Other | Admitting: Anesthesiology

## 2017-01-09 DIAGNOSIS — Z87891 Personal history of nicotine dependence: Secondary | ICD-10-CM

## 2017-01-09 DIAGNOSIS — O368130 Decreased fetal movements, third trimester, not applicable or unspecified: Principal | ICD-10-CM | POA: Diagnosis present

## 2017-01-09 DIAGNOSIS — K219 Gastro-esophageal reflux disease without esophagitis: Secondary | ICD-10-CM | POA: Diagnosis present

## 2017-01-09 DIAGNOSIS — Z3A39 39 weeks gestation of pregnancy: Secondary | ICD-10-CM | POA: Diagnosis not present

## 2017-01-09 DIAGNOSIS — O9962 Diseases of the digestive system complicating childbirth: Secondary | ICD-10-CM | POA: Diagnosis present

## 2017-01-09 DIAGNOSIS — Z3493 Encounter for supervision of normal pregnancy, unspecified, third trimester: Secondary | ICD-10-CM | POA: Diagnosis present

## 2017-01-09 LAB — TYPE AND SCREEN
ABO/RH(D): O POS
ANTIBODY SCREEN: NEGATIVE

## 2017-01-09 LAB — CBC
HCT: 33.1 % — ABNORMAL LOW (ref 35.0–47.0)
HEMOGLOBIN: 10.9 g/dL — AB (ref 12.0–16.0)
MCH: 24.4 pg — ABNORMAL LOW (ref 26.0–34.0)
MCHC: 33 g/dL (ref 32.0–36.0)
MCV: 74 fL — ABNORMAL LOW (ref 80.0–100.0)
PLATELETS: 252 10*3/uL (ref 150–440)
RBC: 4.47 MIL/uL (ref 3.80–5.20)
RDW: 17.9 % — AB (ref 11.5–14.5)
WBC: 9.1 10*3/uL (ref 3.6–11.0)

## 2017-01-09 MED ORDER — PSEUDOEPHEDRINE HCL ER 120 MG PO TB12
120.0000 mg | ORAL_TABLET | Freq: Two times a day (BID) | ORAL | Status: DC | PRN
  Administered 2017-01-09 – 2017-01-11 (×4): 120 mg via ORAL
  Filled 2017-01-09 (×8): qty 1

## 2017-01-09 MED ORDER — PHENYLEPHRINE 40 MCG/ML (10ML) SYRINGE FOR IV PUSH (FOR BLOOD PRESSURE SUPPORT)
80.0000 ug | PREFILLED_SYRINGE | INTRAVENOUS | Status: DC | PRN
  Filled 2017-01-09: qty 5

## 2017-01-09 MED ORDER — IBUPROFEN 600 MG PO TABS
600.0000 mg | ORAL_TABLET | Freq: Four times a day (QID) | ORAL | Status: DC
  Administered 2017-01-10 – 2017-01-11 (×6): 600 mg via ORAL
  Filled 2017-01-09 (×6): qty 1

## 2017-01-09 MED ORDER — MISOPROSTOL 200 MCG PO TABS
ORAL_TABLET | ORAL | Status: AC
  Filled 2017-01-09: qty 4

## 2017-01-09 MED ORDER — OXYCODONE-ACETAMINOPHEN 5-325 MG PO TABS
1.0000 | ORAL_TABLET | ORAL | Status: DC | PRN
  Administered 2017-01-09: 1 via ORAL
  Filled 2017-01-09: qty 1

## 2017-01-09 MED ORDER — ONDANSETRON HCL 4 MG PO TABS: 4.0000 mg | ORAL_TABLET | ORAL | Status: DC | PRN

## 2017-01-09 MED ORDER — DIBUCAINE 1 % RE OINT: 1.0000 "application " | TOPICAL_OINTMENT | RECTAL | Status: DC | PRN

## 2017-01-09 MED ORDER — ACETAMINOPHEN 325 MG PO TABS: 650.0000 mg | ORAL_TABLET | ORAL | Status: DC | PRN

## 2017-01-09 MED ORDER — SODIUM CHLORIDE 0.9 % IV SOLN: 250.0000 mL | INTRAVENOUS | Status: DC | PRN

## 2017-01-09 MED ORDER — ONDANSETRON HCL 4 MG/2ML IJ SOLN: 4.0000 mg | Freq: Four times a day (QID) | INTRAMUSCULAR | Status: DC | PRN

## 2017-01-09 MED ORDER — AMMONIA AROMATIC IN INHA
RESPIRATORY_TRACT | Status: AC
  Filled 2017-01-09: qty 10

## 2017-01-09 MED ORDER — SODIUM CHLORIDE 0.9% FLUSH
3.0000 mL | Freq: Two times a day (BID) | INTRAVENOUS | Status: DC
  Administered 2017-01-09: 3 mL via INTRAVENOUS

## 2017-01-09 MED ORDER — EPHEDRINE 5 MG/ML INJ
10.0000 mg | INTRAVENOUS | Status: DC | PRN
  Filled 2017-01-09: qty 2

## 2017-01-09 MED ORDER — LIDOCAINE HCL (PF) 1 % IJ SOLN
INTRAMUSCULAR | Status: DC | PRN
  Administered 2017-01-09: 3 mL via SUBCUTANEOUS

## 2017-01-09 MED ORDER — TETANUS-DIPHTH-ACELL PERTUSSIS 5-2.5-18.5 LF-MCG/0.5 IM SUSP
0.5000 mL | Freq: Once | INTRAMUSCULAR | Status: DC
  Filled 2017-01-09: qty 0.5

## 2017-01-09 MED ORDER — BENZOCAINE-MENTHOL 20-0.5 % EX AERO
1.0000 "application " | INHALATION_SPRAY | CUTANEOUS | Status: DC | PRN
  Administered 2017-01-09: 1 via TOPICAL
  Filled 2017-01-09: qty 56

## 2017-01-09 MED ORDER — BUPIVACAINE HCL (PF) 0.25 % IJ SOLN
INTRAMUSCULAR | Status: DC | PRN
  Administered 2017-01-09: 3 mL via EPIDURAL
  Administered 2017-01-09: 5 mL via EPIDURAL

## 2017-01-09 MED ORDER — LIDOCAINE HCL (PF) 1 % IJ SOLN
INTRAMUSCULAR | Status: AC
  Filled 2017-01-09: qty 30

## 2017-01-09 MED ORDER — LIDOCAINE-EPINEPHRINE (PF) 1.5 %-1:200000 IJ SOLN
INTRAMUSCULAR | Status: DC | PRN
  Administered 2017-01-09: 3 mL via EPIDURAL

## 2017-01-09 MED ORDER — DIPHENHYDRAMINE HCL 25 MG PO CAPS: 25.0000 mg | ORAL_CAPSULE | Freq: Four times a day (QID) | ORAL | Status: DC | PRN

## 2017-01-09 MED ORDER — ONDANSETRON HCL 4 MG/2ML IJ SOLN: 4.0000 mg | INTRAMUSCULAR | Status: DC | PRN

## 2017-01-09 MED ORDER — MENTHOL 3 MG MT LOZG
1.0000 | LOZENGE | OROMUCOSAL | Status: DC | PRN
  Administered 2017-01-09: 3 mg via ORAL
  Filled 2017-01-09 (×2): qty 9

## 2017-01-09 MED ORDER — FENTANYL 2.5 MCG/ML W/ROPIVACAINE 0.2% IN NS 100 ML EPIDURAL INFUSION (ARMC-ANES)
EPIDURAL | Status: DC | PRN
  Administered 2017-01-09: 9 mL/h via EPIDURAL

## 2017-01-09 MED ORDER — DIPHENHYDRAMINE HCL 50 MG/ML IJ SOLN: 12.5000 mg | INTRAMUSCULAR | Status: DC | PRN

## 2017-01-09 MED ORDER — BUTORPHANOL TARTRATE 1 MG/ML IJ SOLN
1.0000 mg | INTRAMUSCULAR | Status: DC | PRN
  Administered 2017-01-09: 1 mg via INTRAVENOUS
  Filled 2017-01-09: qty 1

## 2017-01-09 MED ORDER — OXYTOCIN 10 UNIT/ML IJ SOLN
INTRAMUSCULAR | Status: AC
  Filled 2017-01-09: qty 2

## 2017-01-09 MED ORDER — ZOLPIDEM TARTRATE 5 MG PO TABS: 5.0000 mg | ORAL_TABLET | Freq: Every evening | ORAL | Status: DC | PRN

## 2017-01-09 MED ORDER — FENTANYL 2.5 MCG/ML W/ROPIVACAINE 0.2% IN NS 100 ML EPIDURAL INFUSION (ARMC-ANES)
EPIDURAL | Status: AC
  Filled 2017-01-09: qty 100

## 2017-01-09 MED ORDER — SENNOSIDES-DOCUSATE SODIUM 8.6-50 MG PO TABS
2.0000 | ORAL_TABLET | ORAL | Status: DC
  Administered 2017-01-10 – 2017-01-11 (×2): 2 via ORAL
  Filled 2017-01-09 (×2): qty 2

## 2017-01-09 MED ORDER — OXYCODONE-ACETAMINOPHEN 5-325 MG PO TABS: 2.0000 | ORAL_TABLET | ORAL | Status: DC | PRN

## 2017-01-09 MED ORDER — IBUPROFEN 600 MG PO TABS
600.0000 mg | ORAL_TABLET | Freq: Four times a day (QID) | ORAL | Status: DC
  Administered 2017-01-09 (×2): 600 mg via ORAL
  Filled 2017-01-09 (×2): qty 1

## 2017-01-09 MED ORDER — OXYTOCIN 40 UNITS IN LACTATED RINGERS INFUSION - SIMPLE MED
2.5000 [IU]/h | INTRAVENOUS | Status: DC
  Filled 2017-01-09: qty 1000

## 2017-01-09 MED ORDER — FENTANYL 2.5 MCG/ML W/ROPIVACAINE 0.2% IN NS 100 ML EPIDURAL INFUSION (ARMC-ANES): 10.0000 mL/h | EPIDURAL | Status: DC

## 2017-01-09 MED ORDER — WITCH HAZEL-GLYCERIN EX PADS: 1.0000 "application " | MEDICATED_PAD | CUTANEOUS | Status: DC | PRN

## 2017-01-09 MED ORDER — SODIUM CHLORIDE 0.9% FLUSH: 3.0000 mL | INTRAVENOUS | Status: DC | PRN

## 2017-01-09 MED ORDER — COCONUT OIL OIL: 1.0000 "application " | TOPICAL_OIL | Status: DC | PRN

## 2017-01-09 MED ORDER — OXYTOCIN BOLUS FROM INFUSION: 500.0000 mL | Freq: Once | INTRAVENOUS | Status: DC

## 2017-01-09 MED ORDER — LACTATED RINGERS IV SOLN: 500.0000 mL | Freq: Once | INTRAVENOUS | Status: DC

## 2017-01-09 MED ORDER — LACTATED RINGERS IV SOLN: 500.0000 mL | INTRAVENOUS | Status: DC | PRN

## 2017-01-09 MED ORDER — SIMETHICONE 80 MG PO CHEW: 80.0000 mg | CHEWABLE_TABLET | ORAL | Status: DC | PRN

## 2017-01-09 MED ORDER — LACTATED RINGERS IV SOLN
INTRAVENOUS | Status: DC
  Administered 2017-01-09: 09:00:00 via INTRAVENOUS

## 2017-01-09 NOTE — Anesthesia Preprocedure Evaluation (Addendum)
Anesthesia Evaluation  Patient identified by MRN, date of birth, ID band Patient awake    Reviewed: Allergy & Precautions, NPO status , Patient's Chart, lab work & pertinent test results, reviewed documented beta blocker date and time   Airway Mallampati: III  TM Distance: >3 FB     Dental  (+) Chipped   Pulmonary asthma , former smoker,           Cardiovascular      Neuro/Psych  Headaches, Fibromyalgia with chronic lower back pain  Neuromuscular disease    GI/Hepatic GERD  ,  Endo/Other    Renal/GU      Musculoskeletal  (+) Fibromyalgia -  Abdominal   Peds  Hematology   Anesthesia Other Findings   Reproductive/Obstetrics (+) Pregnancy                            Anesthesia Physical Anesthesia Plan  ASA: II  Anesthesia Plan: Epidural   Post-op Pain Management:    Induction: Intravenous  Airway Management Planned: Oral ETT  Additional Equipment:   Intra-op Plan:   Post-operative Plan:   Informed Consent: I have reviewed the patients History and Physical, chart, labs and discussed the procedure including the risks, benefits and alternatives for the proposed anesthesia with the patient or authorized representative who has indicated his/her understanding and acceptance.     Plan Discussed with: CRNA  Anesthesia Plan Comments:        Anesthesia Quick Evaluation

## 2017-01-09 NOTE — H&P (Signed)
Obstetrics Admission History & Physical   Contractions   HPI:  21 y.o. [id number] @ 2972w2d (01/14/2017, by Last Menstrual Period). Admitted on 01/09/2017:    Patient Active Problem List   Diagnosis Date Noted  . Decreased fetal movement 01/08/2017  . Normal labor and delivery 12/18/2013  . Vaginal delivery 12/18/2013  . Smoker 12/06/2013  . Migraine, unspecified, without mention of intractable migraine without mention of status migrainosus 12/06/2013  . Hx of pyelonephritis--2013 12/06/2013  . Hearing loss in right ear 12/06/2013  . Asthma, chronic--exercise induced 12/06/2013  . RBC abnormality--[facility]s, [facility], told to take Fe. 12/06/2013  . Fibromyalgia/chronic fatigue 12/06/2013  . Allergy history, drug--prednisone 12/06/2013  . ABDOMINAL PAIN RIGHT LOWER QUADRANT 01/20/2008  . CONSTIPATION, HX OF 01/20/2008     Presents for painful ctxs this am, after going home from hospital yestersday w mild ctxs and a zero cm dilated cervix.  No VB or ROM.  Good FM.Marland Kitchen.  [facility]  PMHx:  Past Medical History:  Diagnosis Date  . Asthma    exercise induced  . Chronic fatigue   . Fibromyalgia   . Hyperthyroidism    PSHx:  Past Surgical History:  Procedure Laterality Date  . NO PAST SURGERIES    . UPPER GASTROINTESTINAL ENDOSCOPY     Medications:  Prescriptions Prior to Admission  Medication Sig Dispense Refill Last Dose  . acetaminophen (TYLENOL) 325 MG tablet Take 325-650 mg by mouth every 6 (six) hours as needed for mild pain or headache.   01/08/2017  . cyclobenzaprine (FLEXERIL) 10 MG tablet Take 1 tablet (10 mg total) by mouth 3 (three) times daily as needed for muscle spasms. 30 tablet 0 01/08/2017  . ferrous sulfate 325 (65 FE) MG EC tablet Take 325 mg by mouth 1 day or 1 dose.   01/08/2017   Allergies: is allergic to other; phenergan [promethazine hcl]; prednisone; and vicodin [hydrocodone-acetaminophen]. OBHx:  OB History  Gravida Para Term Preterm AB  Living  3 2 2     2   SAB TAB Ectopic Multiple Live Births          2    # Outcome Date GA Lbr Len/2nd Weight Sex Delivery Anes PTL Lv  3 Current           2 Term 12/18/13 5823w1d 12:10 / 00:25 2.205 kg (4 lb 13.8 oz) F Vag-Spont EPI  LIV  1 Term      Vag-Spont        YNW:GNFAOZHY/QMVHQIONGEXBFHx:Negative/unremarkable except as detailed in HPI. Soc Hx: Never smoker, Alcohol: none and Recreational drug use: none  Objective:   Vitals:   01/09/17 0736 01/09/17 0737  BP: 140/80   Pulse: 97   Resp:  16  Temp:  98.5 F (36.9 C)   General: Well nourished, well developed female in no acute distress.  Skin: Warm and dry.  Cardiovascular:Regular rate and rhythm. Respiratory: Clear to auscultation bilateral. Normal respiratory effort Abdomen: marked Neuro/Psych: Normal mood and affect.   Pelvic exam: is not limited by body habitus EGBUS: within normal limits Vagina: within normal limits and with normal mucosa blood in the vault Cervix: 6-7/80/0 Uterus: Spontaneous uterine activity  Adnexa: not evaluated  EFM:FHR: 140 bpm, variability: moderate,  accelerations:  Present,  decelerations:  Absent Toco: Frequency: Every 2-4 minutes   Perinatal info:  Blood type: O positive Rubella- Immune Varicella -Immune TDaP tetanus status unknown to the patient RPR NR / HIV Neg/ HBsAg Neg   Assessment &  Plan:   21 y.o. [id number] @ [redacted]w[redacted]d, Admitted on 01/09/2017:Actively progressing NORMAL LABOR     Admit for labor, Observe for cervical change, Fetal Wellbeing Reassuring, Epidural when ready and AROM when Appropriate  May not have time for epidural, progressing quickly now (was 3 on first exam, 6-7 now).  Annamarie Major, MD

## 2017-01-09 NOTE — Discharge Summary (Signed)
Obstetrical Discharge Summary  Date of Admission: 01/09/2017 Date of Discharge: 01/11/2017  Discharge Diagnosis: Term Pregnancy-delivered Primary OB:  [facility]   Gestational Age at Delivery: 8167w2d  Antepartum complications: none Date of Delivery: 01/09/17 Delivery Note Primary OB: [facility] Delivery Physician: Annamarie MajorPaul Harris, MD Gestational Age: Full term Antepartum complications: none Intrapartum complications: None  A viable Female was delivered via vertex perentation.  Apgars:8 ,9  Weight:  pending .   Placenta status: spontaneous and Intact.  Cord: 3+ vessels;  with the following complications: none.  Anesthesia:  epidural Episiotomy:  none Lacerations:  none Suture Repair: none Est. Blood Loss (mL):  less than 100 mL  Mom to postpartum.  Baby to Couplet care / Skin to Skin.  Post partum course: Since the delivery, patient has tolerate activity, diet, and daily functions without difficulty or complication.  Min lochia.  No breast concerns at this time.  No signs of depression currently.   BP 129/75 (BP Location: Right Arm)   Pulse 96   Temp 97.9 F (36.6 C) (Axillary)   Resp 20   Ht 5\' 7"  (1.702 m)   Wt 235 lb (106.6 kg)   LMP 04/09/2016   SpO2 100%   BMI 36.81 kg/m   Postpartum Exam:General appearance: alert, cooperative and no distress GI: soft, non-tender; bowel sounds normal; no masses,  no organomegaly and Fundus firm Extremities: extremities normal, atraumatic, no cyanosis or edema  Disposition: home with infant Rh Immune globulin given: not applicable Rubella vaccine given: no Varicella vaccine given: no Tdap vaccine given in AP or PP setting: given during prenatal care Flu vaccine given in AP or PP setting: given during prenatal care Contraception: to be determined at post partum visit  Prenatal Labs: O POS//Rubella Immune//RPR negative//HIV negative/HepB Surface Ag negative//plans to breastfeed, plans to bottle feed  Plan:  [name] was  discharged to home in good condition. [facility] weeks  Discharge Medications: Allergies as of 01/11/2017      Reactions   Other Nausea And Vomiting, Other (See Comments)   Blue Cheese causes sweating nausea and vomitting   Phenergan [promethazine Hcl]    Prednisone Other (See Comments)   Severe Depression   Vicodin [hydrocodone-acetaminophen] Nausea And Vomiting      Medication List    TAKE these medications   acetaminophen 325 MG tablet Commonly known as:  TYLENOL Take 325-650 mg by mouth every 6 (six) hours as needed for mild pain or headache.   cyclobenzaprine 10 MG tablet Commonly known as:  FLEXERIL Take 1 tablet (10 mg total) by mouth 3 (three) times daily as needed for muscle spasms.   ferrous sulfate 325 (65 FE) MG EC tablet Take 325 mg by mouth 1 day or 1 dose.   ibuprofen 600 MG tablet Commonly known as:  ADVIL,MOTRIN Take 1 tablet (600 mg total) by mouth every 6 (six) hours.       Follow-up arrangements:  Follow-up Information    Letitia Libraobert Paul Harris, MD. Schedule an appointment as soon as possible for a visit in 6 week(s).   Specialty:  Obstetrics and Gynecology Contact information: [address] [place] [address] [phone]            Thomasene MohairStephen Laron Angelini, MD 01/11/2017 10:57 AM

## 2017-01-09 NOTE — Discharge Instructions (Signed)

## 2017-01-09 NOTE — Progress Notes (Signed)
Patient came in complaining of severe contraction pain for the "last few days". Patient states the contractions have become more painful since leaving the hospital last night. Patient denies leaking fluid and says she has a small amount of mucous discharge and some old blood when wiping. Monitors applied.

## 2017-01-09 NOTE — Anesthesia Procedure Notes (Addendum)
Epidural Patient location during procedure: OB  Staffing Anesthesiologist: Berdine AddisonHOMAS, MATHAI Resident/CRNA: Mathews ArgyleLOGAN, Tashay Bozich Performed: anesthesiologist and resident/CRNA   Preanesthetic Checklist Completed: patient identified, site marked, surgical consent, pre-op evaluation, timeout performed, IV checked, risks and benefits discussed and monitors and equipment checked  Epidural Patient position: sitting Prep: ChloraPrep and site prepped and draped Patient monitoring: heart rate, continuous pulse ox and blood pressure Approach: midline Location: L4-L5 Injection technique: LOR saline  Needle:  Needle type: Tuohy  Needle gauge: 17 G Needle length: 9 cm and 9 Needle insertion depth: 8 cm Catheter type: closed end flexible Catheter size: 19 Gauge Catheter at skin depth: 13 cm Test dose: negative and 1.5% lidocaine with Epi 1:200 K  Assessment Events: blood not aspirated, injection not painful, no injection resistance, negative IV test and no paresthesia  Additional Notes   Patient tolerated the insertion well without complications.Reason for block:procedure for pain

## 2017-01-10 LAB — CBC
HCT: 25.7 % — ABNORMAL LOW (ref 35.0–47.0)
Hemoglobin: 8.6 g/dL — ABNORMAL LOW (ref 12.0–16.0)
MCH: 25.2 pg — ABNORMAL LOW (ref 26.0–34.0)
MCHC: 33.6 g/dL (ref 32.0–36.0)
MCV: 75.1 fL — ABNORMAL LOW (ref 80.0–100.0)
PLATELETS: 191 10*3/uL (ref 150–440)
RBC: 3.43 MIL/uL — ABNORMAL LOW (ref 3.80–5.20)
RDW: 18.1 % — AB (ref 11.5–14.5)
WBC: 7.7 10*3/uL (ref 3.6–11.0)

## 2017-01-10 NOTE — Anesthesia Postprocedure Evaluation (Signed)
Anesthesia Post Note  Patient: [name]  Procedure(s) Performed: * No procedures listed *  Patient location during evaluation: Mother Baby Anesthesia Type: General and Epidural Level of consciousness: awake, awake and alert and oriented Pain management: pain level controlled Vital Signs Assessment: post-procedure vital signs reviewed and stable Respiratory status: spontaneous breathing, nonlabored ventilation and respiratory function stable Cardiovascular status: stable Postop Assessment: no headache and no backache Anesthetic complications: no     Last Vitals:  Vitals:   01/09/17 2350 01/10/17 0355  BP: 126/66 104/61  Pulse: 96 80  Resp: 20 18  Temp: 36.3 C 36.5 C    Last Pain:  Vitals:   01/10/17 0355  TempSrc: Oral  PainSc:                  Lyn RecordsNoles,  Shantai Tiedeman R

## 2017-01-11 LAB — RPR: RPR Ser Ql: NONREACTIVE

## 2017-01-11 MED ORDER — IBUPROFEN 600 MG PO TABS: 600.0000 mg | ORAL_TABLET | Freq: Four times a day (QID) | ORAL | 0 refills | Status: AC

## 2019-09-20 ENCOUNTER — Emergency Department (HOSPITAL_COMMUNITY)
Admission: EM | Admit: 2019-09-20 | Discharge: 2019-09-20 | Disposition: A | Discharge status: Home / Self Care | Payer: Self-pay | Attending: Emergency Medicine | Admitting: Emergency Medicine

## 2019-09-20 ENCOUNTER — Other Ambulatory Visit: Payer: Self-pay

## 2019-09-20 ENCOUNTER — Emergency Department (HOSPITAL_COMMUNITY): Payer: Self-pay

## 2019-09-20 ENCOUNTER — Other Ambulatory Visit: Payer: Self-pay | Admitting: Physician Assistant

## 2019-09-20 ENCOUNTER — Encounter (HOSPITAL_COMMUNITY): Payer: Self-pay | Admitting: Emergency Medicine

## 2019-09-20 DIAGNOSIS — Z87891 Personal history of nicotine dependence: Secondary | ICD-10-CM | POA: Insufficient documentation

## 2019-09-20 DIAGNOSIS — J45909 Unspecified asthma, uncomplicated: Secondary | ICD-10-CM | POA: Insufficient documentation

## 2019-09-20 DIAGNOSIS — N760 Acute vaginitis: Secondary | ICD-10-CM | POA: Insufficient documentation

## 2019-09-20 DIAGNOSIS — R110 Nausea: Secondary | ICD-10-CM | POA: Insufficient documentation

## 2019-09-20 DIAGNOSIS — B9689 Other specified bacterial agents as the cause of diseases classified elsewhere: Secondary | ICD-10-CM | POA: Insufficient documentation

## 2019-09-20 DIAGNOSIS — Z79899 Other long term (current) drug therapy: Secondary | ICD-10-CM | POA: Insufficient documentation

## 2019-09-20 DIAGNOSIS — N831 Corpus luteum cyst of ovary, unspecified side: Secondary | ICD-10-CM | POA: Insufficient documentation

## 2019-09-20 DIAGNOSIS — N939 Abnormal uterine and vaginal bleeding, unspecified: Secondary | ICD-10-CM

## 2019-09-20 LAB — I-STAT CHEM 8, ED
BUN: 11 mg/dL (ref 6–20)
Calcium, Ion: 1.23 mmol/L (ref 1.15–1.40)
Chloride: 103 mmol/L (ref 98–111)
Creatinine, Ser: 0.6 mg/dL (ref 0.44–1.00)
Glucose, Bld: 80 mg/dL (ref 70–99)
HCT: 39 % (ref 36.0–46.0)
Hemoglobin: 13.3 g/dL (ref 12.0–15.0)
Potassium: 3.8 mmol/L (ref 3.5–5.1)
Sodium: 140 mmol/L (ref 135–145)
TCO2: 27 mmol/L (ref 22–32)

## 2019-09-20 LAB — WET PREP, GENITAL
Sperm: NONE SEEN
Trich, Wet Prep: NONE SEEN
Yeast Wet Prep HPF POC: NONE SEEN

## 2019-09-20 LAB — URINALYSIS, ROUTINE W REFLEX MICROSCOPIC
Bilirubin Urine: NEGATIVE
Glucose, UA: NEGATIVE mg/dL
Hgb urine dipstick: NEGATIVE
Ketones, ur: NEGATIVE mg/dL
Leukocytes,Ua: NEGATIVE
Nitrite: NEGATIVE
Protein, ur: NEGATIVE mg/dL
Specific Gravity, Urine: 1.021 (ref 1.005–1.030)
pH: 7 (ref 5.0–8.0)

## 2019-09-20 LAB — I-STAT BETA HCG BLOOD, ED (MC, WL, AP ONLY): I-stat hCG, quantitative: <5 m[IU]/mL (ref <5)

## 2019-09-20 MED ORDER — METRONIDAZOLE 500 MG PO TABS: 500.0000 mg | ORAL_TABLET | Freq: Two times a day (BID) | ORAL | 0 refills | Status: DC

## 2019-09-20 MED ORDER — METRONIDAZOLE 500 MG PO TABS: 500.0000 mg | ORAL_TABLET | Freq: Two times a day (BID) | ORAL | 0 refills | Status: AC

## 2019-09-20 NOTE — Discharge Instructions (Signed)
You were given a prescription for antibiotics. Please take the antibiotic prescription fully. Do not drink alcohol while taking this antibiotic.  Please follow up with your primary care provider within 5-7 days for re-evaluation of your symptoms. If you do not have a primary care provider, information for a healthcare clinic has been provided for you to make arrangements for follow up care. Please return to the emergency department for any new or worsening symptoms.

## 2019-09-20 NOTE — ED Notes (Signed)
Patient verbalizes understanding of discharge instructions. Opportunity for questioning and answers were provided. Armband removed by staff, pt discharged from ED.  

## 2019-09-20 NOTE — ED Triage Notes (Signed)
Pt reports new vaginal bleeding this am, reports not using pads or tampons, states, "It's soaked through my underwear twice." Pt reports finishing period last week, reports concerns with IUD.

## 2019-09-20 NOTE — ED Provider Notes (Signed)
[facility] EMERGENCY DEPARTMENT Provider Note   CSN: [id number] Arrival date & time: 09/20/19  1347     History   Chief Complaint Chief Complaint  Patient presents with  . Vaginal Bleeding    HPI [name] is a 23 y.o. female.     HPI   Patient is a 23 year old female with a history of asthma, chronic fatigue, fibromyalgia, hypothyroidism, who presents to the emergency department today for evaluation of vaginal bleeding.  States that 2 weeks ago she started her menstrual cycle and bled for 2 weeks.  She stopped bleeding for about 4 days but then started having heavy vaginal bleeding this morning.  States she was passing clots.  She is not able to quantify the bleeding as she was not wearing a pad or using tampons at that time.  She also describes pelvic pressure that she states is somewhat worse on the left.  She has had some nausea.  She has had no vomiting, diarrhea, constipation.  She denies any dysuria, frequency, urgency.  She has had abnormal vaginal discharge for the last several days.  She is currently sexually active with one female partner.  They do not use protection.  States they were both tested for STDs prior to having intercourse with one another.  Patient declined HIV/RPR testing.  Past Medical History:  Diagnosis Date  . Asthma    exercise induced  . Chronic fatigue   . Fibromyalgia   . Hyperthyroidism     Patient Active Problem List   Diagnosis Date Noted  . Indication for care in labor or delivery 01/09/2017  . Decreased fetal movement 01/08/2017  . Normal labor and delivery 12/18/2013  . Vaginal delivery 12/18/2013  . Smoker 12/06/2013  . Migraine, unspecified, without mention of intractable migraine without mention of status migrainosus 12/06/2013  . Hx of pyelonephritis--2013 12/06/2013  . Hearing loss in right ear 12/06/2013  . Asthma, chronic--exercise induced 12/06/2013  . RBC abnormality--small RBCs, dx at [facility], told to  take Fe. 12/06/2013  . Fibromyalgia/chronic fatigue 12/06/2013  . Allergy history, drug--prednisone 12/06/2013  . ABDOMINAL PAIN RIGHT LOWER QUADRANT 01/20/2008  . CONSTIPATION, HX OF 01/20/2008    Past Surgical History:  Procedure Laterality Date  . COLONOSCOPY  2008  . ESOPHAGOGASTRODUODENOSCOPY  2008  . NO PAST SURGERIES    . UPPER GASTROINTESTINAL ENDOSCOPY       OB History    Gravida  3   Para  2   Term  2   Preterm      AB      Living  2     SAB      TAB      Ectopic      Multiple      Live Births  2            Home Medications    Prior to Admission medications   Medication Sig Start Date End Date Taking? Authorizing Provider  acetaminophen (TYLENOL) 325 MG tablet Take 325-650 mg by mouth every 6 (six) hours as needed for mild pain or headache.    [provider]  cyclobenzaprine (FLEXERIL) 10 MG tablet Take 1 tablet (10 mg total) by mouth 3 (three) times daily as needed for muscle spasms. 10/27/16   Katrinka Blazing, [place], CNM  ferrous sulfate 325 (65 FE) MG EC tablet Take 325 mg by mouth 1 day or 1 dose.    [provider]  ibuprofen (ADVIL,MOTRIN) 600 MG tablet Take 1  tablet (600 mg total) by mouth every 6 (six) hours. 01/11/17   Will Bonnet, MD  metroNIDAZOLE (FLAGYL) 500 MG tablet Take 1 tablet (500 mg total) by mouth 2 (two) times daily. 09/20/19   Alexiss Iturralde S, PA-C    Family History Family History  Problem Relation Age of Onset  . Hypertension Mother   . Hyperlipidemia Father   . Hypertension Father   . Heart disease Sister   . Asthma Sister   . Multiple sclerosis Paternal Aunt   . Hypertension Maternal Grandmother   . Cancer Maternal Grandmother   . Dementia Maternal Grandmother   . Breast cancer Maternal Grandmother     Social History Social History   Tobacco Use  . Smoking status: Former Smoker    Packs/day: 0.25    Types: Cigarettes    Quit date: 10/24/2014    Years since quitting: 4.9  . Smokeless  tobacco: Never Used  Substance Use Topics  . Alcohol use: Yes    Comment: occas  . Drug use: No     Allergies   Other, Phenergan [promethazine hcl], Prednisone, and Vicodin [hydrocodone-acetaminophen]   Review of Systems Review of Systems  Constitutional: Negative for fever.  Eyes: Negative for visual disturbance.  Respiratory: Negative for shortness of breath.   Cardiovascular: Negative for chest pain.  Gastrointestinal: Positive for nausea. Negative for abdominal pain, constipation, diarrhea and vomiting.  Genitourinary: Positive for pelvic pain, vaginal bleeding and vaginal discharge. Negative for dysuria, frequency, hematuria and urgency.  Musculoskeletal: Negative for back pain.  Skin: Negative for rash.     Physical Exam Updated Vital Signs BP 124/78 (BP Location: Right Arm)   Pulse 83   Temp 98.5 F (36.9 C) (Oral)   Resp 18   LMP 08/30/2019 (Approximate)   SpO2 98%   Physical Exam Vitals signs and nursing note reviewed.  Constitutional:      General: She is not in acute distress.    Appearance: She is well-developed.  HENT:     Head: Normocephalic and atraumatic.  Eyes:     Conjunctiva/sclera: Conjunctivae normal.  Neck:     Musculoskeletal: Neck supple.  Cardiovascular:     Rate and Rhythm: Normal rate and regular rhythm.     Pulses: Normal pulses.     Heart sounds: Normal heart sounds. No murmur.  Pulmonary:     Effort: Pulmonary effort is normal. No respiratory distress.     Breath sounds: Normal breath sounds. No wheezing, rhonchi or rales.  Abdominal:     General: Bowel sounds are normal.     Palpations: Abdomen is soft.     Tenderness: There is no abdominal tenderness. There is no guarding or rebound.  Genitourinary:    Comments: Exam performed by Rodney Booze,  exam chaperoned Date: 09/20/2019 Pelvic exam: normal external genitalia without evidence of trauma. VULVA: normal appearing vulva with no masses, tenderness or lesion. VAGINA:  normal appearing vagina with normal color and discharge, no lesions. Scant blood in the vaginal vault.  CERVIX: normal appearing cervix without lesions, cervical motion tenderness absent, cervical os open with out purulent discharge; no vaginal discharge. Wet prep and DNA probe for chlamydia and GC obtained.   ADNEXA: normal adnexa in size, nontender and no masses UTERUS: uterus is normal size, shape, consistency and mildly TTP Skin:    General: Skin is warm and dry.  Neurological:     Mental Status: She is alert.      ED Treatments / Results  Labs (all labs ordered are listed, but only abnormal results are displayed) Labs Reviewed  WET PREP, GENITAL - Abnormal; Notable for the following components:      Result Value   Clue Cells Wet Prep HPF POC PRESENT (*)    WBC, Wet Prep HPF POC MANY (*)    All other components within normal limits  URINALYSIS, ROUTINE W REFLEX MICROSCOPIC  I-STAT BETA HCG BLOOD, ED ([facility], [facility], [facility] ONLY)  I-STAT CHEM 8, ED  GC/CHLAMYDIA PROBE AMP ([place]) NOT AT [facility]    EKG None  Radiology US Transvaginal Non-ob  Addendum Date: 09/20/2019   ADDENDUM REPORT: 09/20/2019 18:11 ADDENDUM: An IUD is appropriately placed within the endometrial canal towards the fundus. Electronically Signed   By: Gerome Sam III M.D   On: 09/20/2019 18:11   Result Date: 09/20/2019 CLINICAL DATA:  Vaginal bleeding. EXAM: TRANSABDOMINAL AND TRANSVAGINAL ULTRASOUND OF PELVIS DOPPLER ULTRASOUND OF OVARIES TECHNIQUE: Both transabdominal and transvaginal ultrasound examinations of the pelvis were performed. Transabdominal technique was performed for global imaging of the pelvis including uterus, ovaries, adnexal regions, and pelvic cul-de-sac. It was necessary to proceed with endovaginal exam following the transabdominal exam to visualize the endometrium and ovaries. Color and duplex Doppler ultrasound was utilized to evaluate blood flow to the ovaries. COMPARISON:  None. FINDINGS:  Uterus Measurements: 6.6 x 4.0 x 4.8 cm = volume: 66.9 mL. No fibroids or other mass visualized. Endometrium Thickness: 5 mm. There is a small amount of fluid in the endometrial canal. Right ovary Measurements: 3.9 x 2.1 x 2.3 cm = volume: 9.8 mL. Contains a corpus luteum cyst measuring 2 x 1.6 cm. Left ovary Measurements: 2.7 x 1.3 x 1.4 cm = volume: 2.7 mL. Normal appearance/no adnexal mass. Pulsed Doppler evaluation of both ovaries demonstrates normal low-resistance arterial and venous waveforms. Other findings Trace fluid in the pelvis is most likely physiologic. IMPRESSION: 1. A small amount of fluid is seen in the endometrial canal, likely blood products given history of bleeding. 2. Corpus luteum cyst in the right ovary measuring 2 cm. 3. No other abnormalities identified. Electronically Signed: By: Gerome Sam III M.D On: 09/20/2019 17:44   US Pelvis Complete  Addendum Date: 09/20/2019   ADDENDUM REPORT: 09/20/2019 18:11 ADDENDUM: An IUD is appropriately placed within the endometrial canal towards the fundus. Electronically Signed   By: Gerome Sam III M.D   On: 09/20/2019 18:11   Result Date: 09/20/2019 CLINICAL DATA:  Vaginal bleeding. EXAM: TRANSABDOMINAL AND TRANSVAGINAL ULTRASOUND OF PELVIS DOPPLER ULTRASOUND OF OVARIES TECHNIQUE: Both transabdominal and transvaginal ultrasound examinations of the pelvis were performed. Transabdominal technique was performed for global imaging of the pelvis including uterus, ovaries, adnexal regions, and pelvic cul-de-sac. It was necessary to proceed with endovaginal exam following the transabdominal exam to visualize the endometrium and ovaries. Color and duplex Doppler ultrasound was utilized to evaluate blood flow to the ovaries. COMPARISON:  None. FINDINGS: Uterus Measurements: 6.6 x 4.0 x 4.8 cm = volume: 66.9 mL. No fibroids or other mass visualized. Endometrium Thickness: 5 mm. There is a small amount of fluid in the endometrial canal. Right ovary  Measurements: 3.9 x 2.1 x 2.3 cm = volume: 9.8 mL. Contains a corpus luteum cyst measuring 2 x 1.6 cm. Left ovary Measurements: 2.7 x 1.3 x 1.4 cm = volume: 2.7 mL. Normal appearance/no adnexal mass. Pulsed Doppler evaluation of both ovaries demonstrates normal low-resistance arterial and venous waveforms. Other findings Trace fluid in the pelvis is most likely physiologic. IMPRESSION:  1. A small amount of fluid is seen in the endometrial canal, likely blood products given history of bleeding. 2. Corpus luteum cyst in the right ovary measuring 2 cm. 3. No other abnormalities identified. Electronically Signed: By: Gerome Samavid  Williams III M.D On: 09/20/2019 17:44   [place] Art/ven Flow Abd Pelv Doppler  Addendum Date: 09/20/2019   ADDENDUM REPORT: 09/20/2019 18:11 ADDENDUM: An IUD is appropriately placed within the endometrial canal towards the fundus. Electronically Signed   By: Gerome Samavid  Williams III M.D   On: 09/20/2019 18:11   Result Date: 09/20/2019 CLINICAL DATA:  Vaginal bleeding. EXAM: TRANSABDOMINAL AND TRANSVAGINAL ULTRASOUND OF PELVIS DOPPLER ULTRASOUND OF OVARIES TECHNIQUE: Both transabdominal and transvaginal ultrasound examinations of the pelvis were performed. Transabdominal technique was performed for global imaging of the pelvis including uterus, ovaries, adnexal regions, and pelvic cul-de-sac. It was necessary to proceed with endovaginal exam following the transabdominal exam to visualize the endometrium and ovaries. Color and duplex Doppler ultrasound was utilized to evaluate blood flow to the ovaries. COMPARISON:  None. FINDINGS: Uterus Measurements: 6.6 x 4.0 x 4.8 cm = volume: 66.9 mL. No fibroids or other mass visualized. Endometrium Thickness: 5 mm. There is a small amount of fluid in the endometrial canal. Right ovary Measurements: 3.9 x 2.1 x 2.3 cm = volume: 9.8 mL. Contains a corpus luteum cyst measuring 2 x 1.6 cm. Left ovary Measurements: 2.7 x 1.3 x 1.4 cm = volume: 2.7 mL. Normal  appearance/no adnexal mass. Pulsed Doppler evaluation of both ovaries demonstrates normal low-resistance arterial and venous waveforms. Other findings Trace fluid in the pelvis is most likely physiologic. IMPRESSION: 1. A small amount of fluid is seen in the endometrial canal, likely blood products given history of bleeding. 2. Corpus luteum cyst in the right ovary measuring 2 cm. 3. No other abnormalities identified. Electronically Signed: By: Gerome Samavid  Williams III M.D On: 09/20/2019 17:44    Procedures Procedures (including critical care time)  Medications Ordered in ED Medications - No data to display   Initial Impression / Assessment and Plan / ED Course  I have reviewed the triage vital signs and the nursing notes.  Pertinent labs & imaging results that were available during my care of the patient were reviewed by me and considered in my medical decision making (see chart for details).   Final Clinical Impressions(s) / ED Diagnoses   Final diagnoses:  BV (bacterial vaginosis)  Abnormal uterine bleeding  Corpus luteum cyst   23 year old female presenting for abnormal vaginal bleeding and pelvic pain.  History IUD.  Associated with vaginal discharge.  She has had unprotected intercourse with one female partner recently.  On exam no abdominal tenderness.  She does have some uterine tenderness on exam.  She has scant blood in the vaginal vault.  No cervical motion tenderness or adnexal tenderness.  Hemoglobin is normal.  Electrolytes normal Beta-hCG negative Wet prep with clue cells and WBCs GC/chlamydia pending ua non-concerning for UTI  Pelvis US revealed a small amount of fluid in the endometrial canal likely secondary to recent bleeding, corpus luteum cyst in the right ovary that is 2 cm.  IUD in place without complication.  Reassessed patient and discussed results.  She likely has abnormal uterine bleeding which may be secondary to her IUD.  She appears to be stable at this time.   I feel that she can follow-up as an outpatient for this.  I will give her Rx for Flagyl to treat her BV.  Advised on this was  a return precautions.  She voiced understanding and is in agreement plan.  All questions answered.  Patient stable for discharge.  ED Discharge Orders         Ordered    metroNIDAZOLE (FLAGYL) 500 MG tablet  2 times daily     09/20/19 [address], [place], PA-C 09/20/19 1841    Raeford Razor, MD 09/23/19 0930

## 2019-09-22 LAB — CERVICOVAGINAL ANCILLARY ONLY
Chlamydia: NEGATIVE
Neisseria Gonorrhea: NEGATIVE

## 2019-11-02 IMAGING — US US ART/VEN ABD/PELV/SCROTUM DOPPLER LTD
1 series · 13 of 25 positions shown · non-contrast
Comparison: None.
COMPARISON: None.

Addendum:
CLINICAL DATA: Vaginal bleeding.

EXAM:
TRANSABDOMINAL AND TRANSVAGINAL ULTRASOUND OF PELVIS
DOPPLER ULTRASOUND OF OVARIES
TECHNIQUE: Both transabdominal and transvaginal ultrasound examinations of the
pelvis were performed. Transabdominal technique was performed for
global imaging of the pelvis including uterus, ovaries, adnexal
regions, and pelvic cul-de-sac.
It was necessary to proceed with endovaginal exam following the
transabdominal exam to visualize the endometrium and ovaries. Color
and duplex Doppler ultrasound was utilized to evaluate blood flow to
the ovaries.

[Series 1: us art/ven abd/pelv/scrotum doppler ltd · 65 acquisitions, 13 frames shown]
[im 1/65]
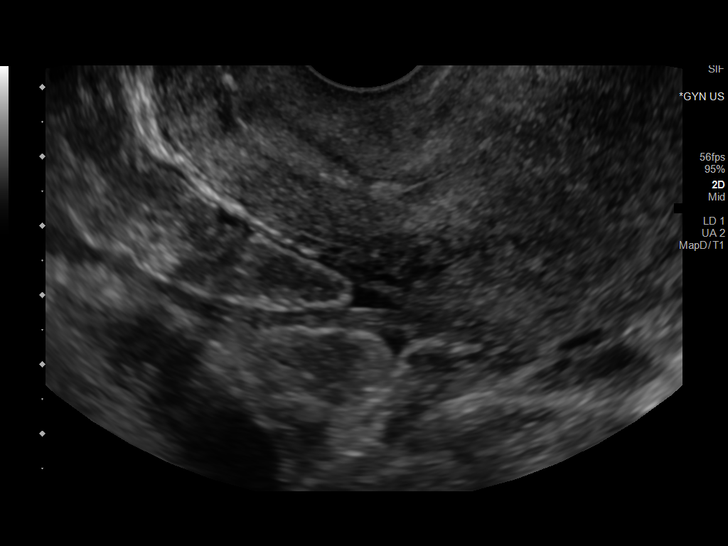
[im 6/65]
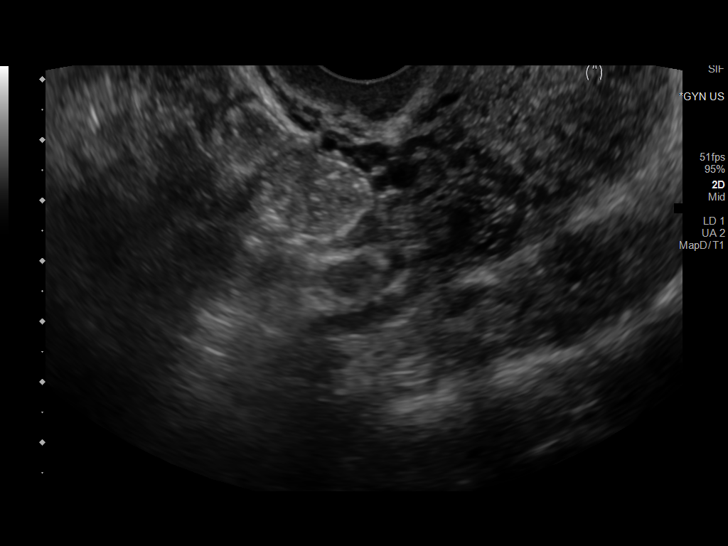
[im 11/65]
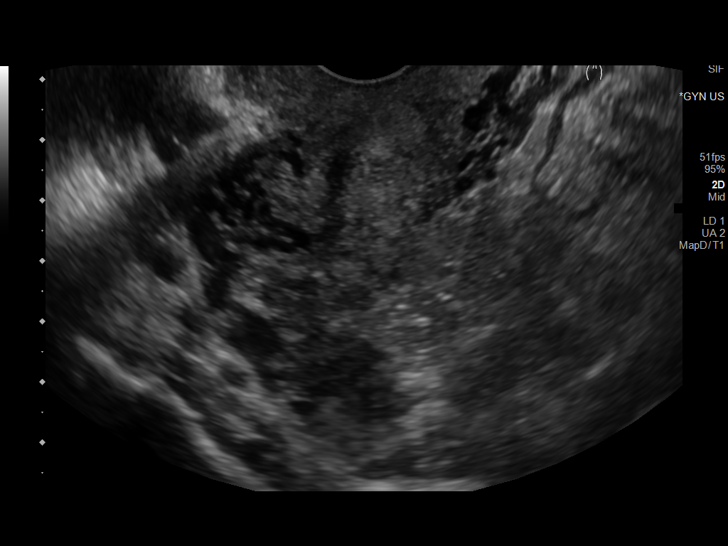
[im 17/65]
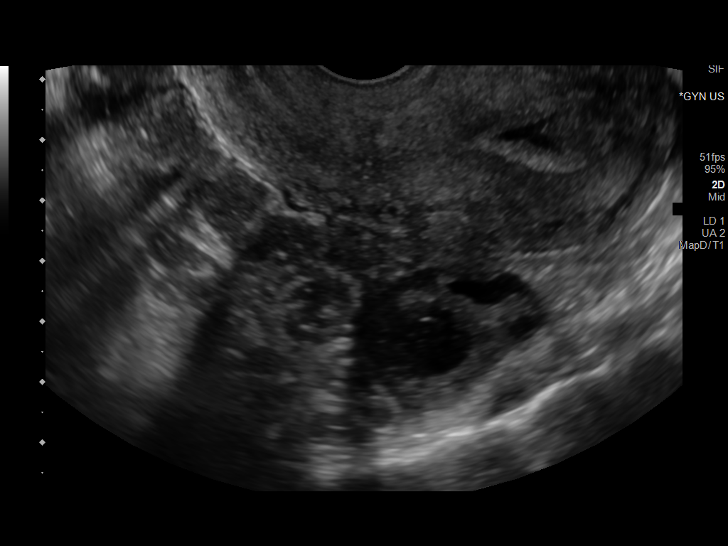
[im 22/65]
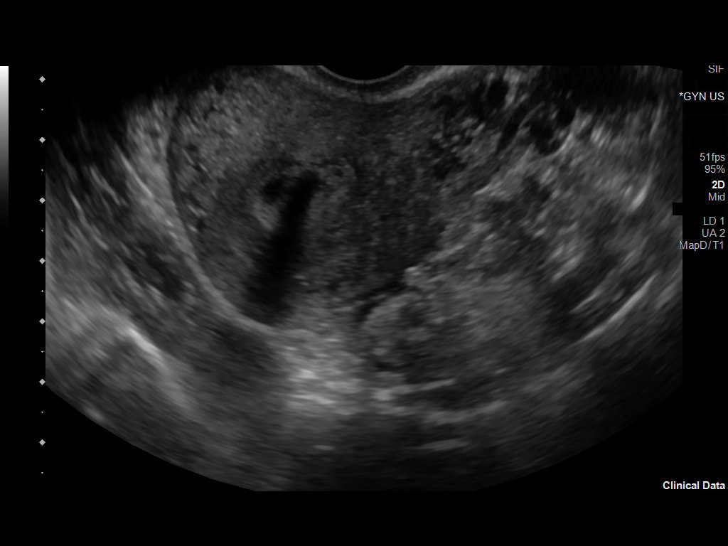
[im 27/65]
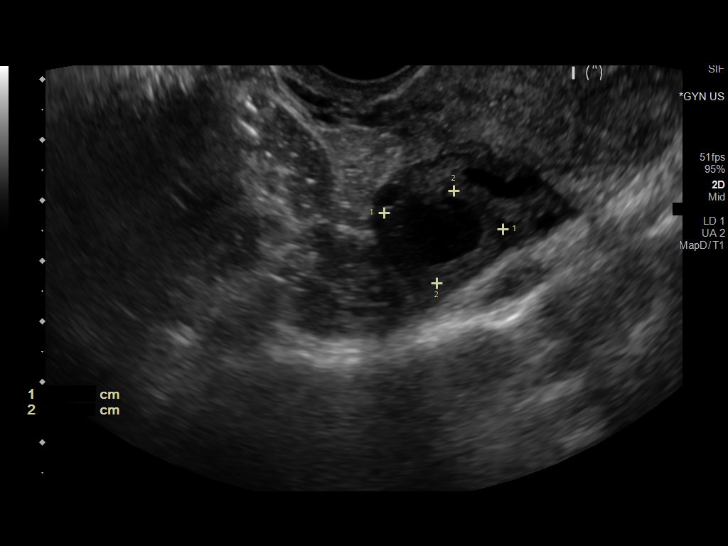
[im 33/65]
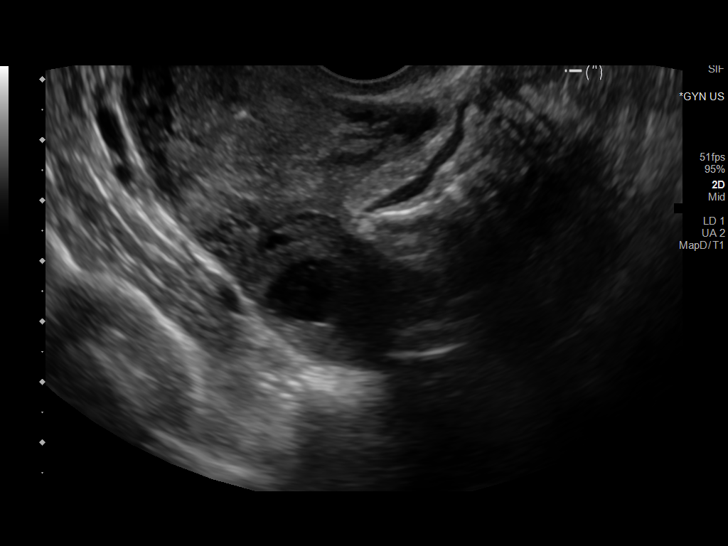
[im 38/65]
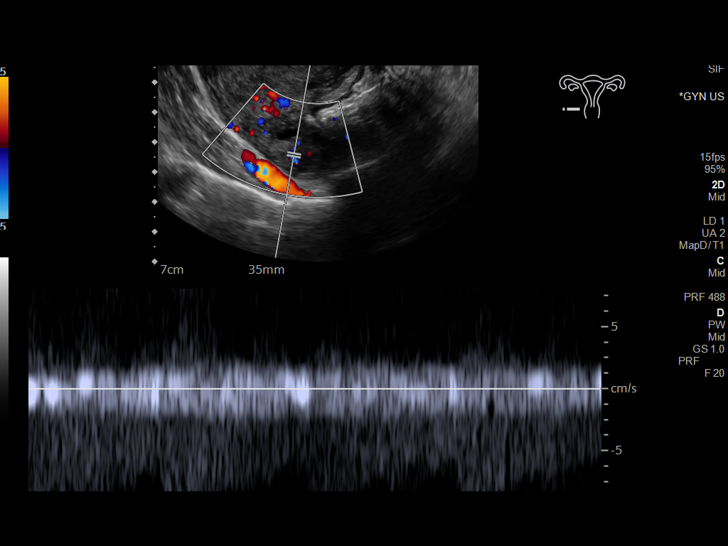
[im 43/65]
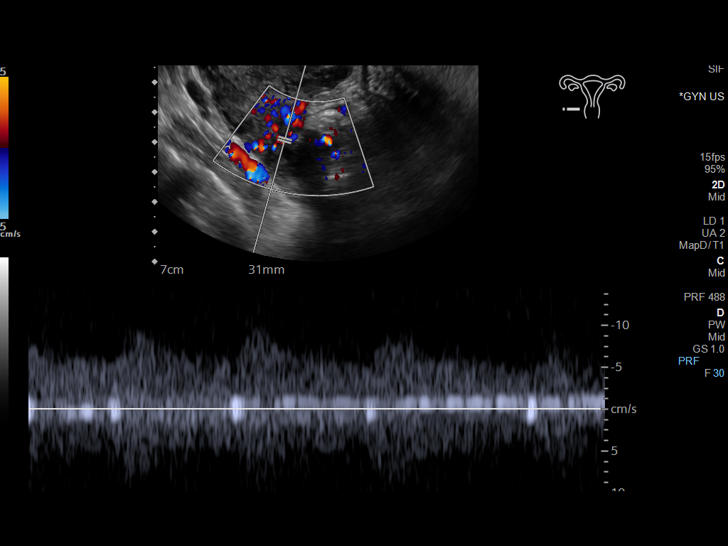
[im 49/65]
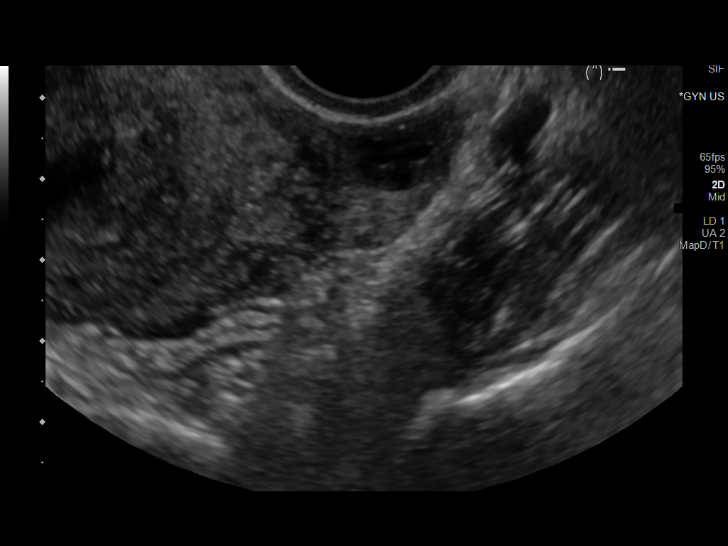
[im 54/65]
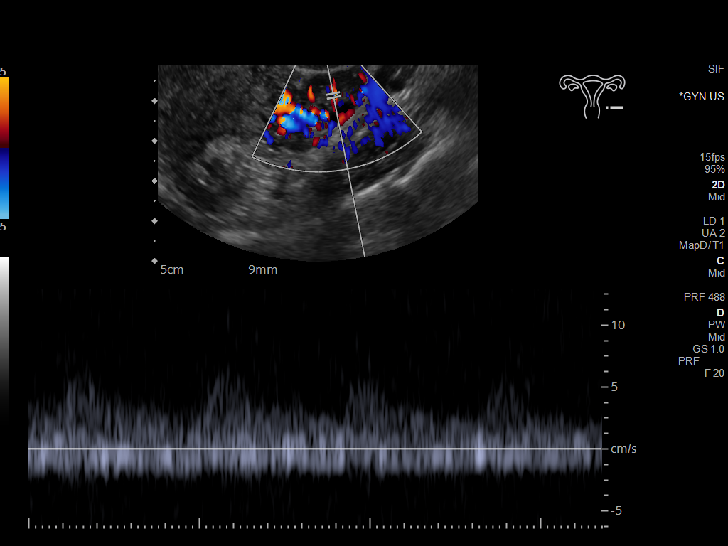
[im 59/65]
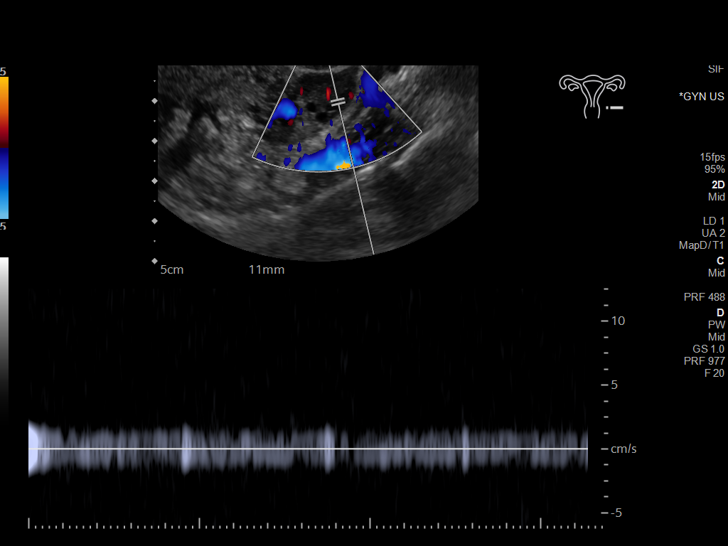
[im 65/65]
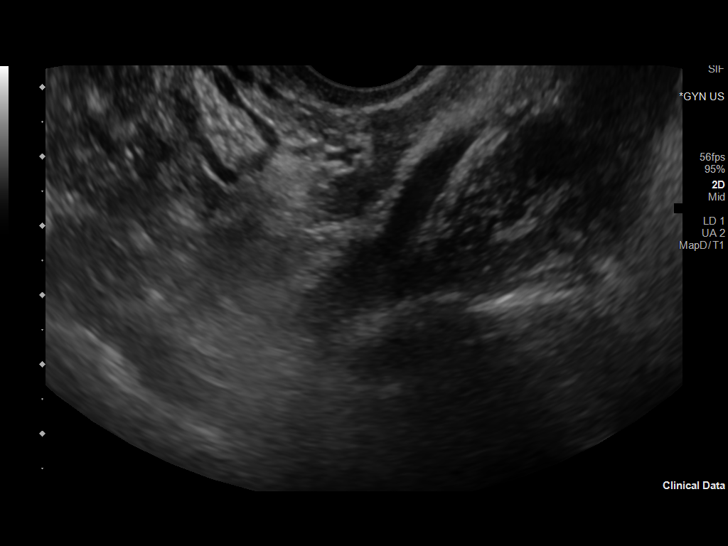

[13 of 25 positions shown; findings below may reference images not displayed]

FINDINGS: Uterus

Measurements: 6.6 x 4.0 x 4.8 cm = volume: 66.9 mL. No fibroids or
other mass visualized.

Endometrium

Thickness: 5 mm. There is a small amount of fluid in the endometrial
canal.

Right ovary

Measurements: 3.9 x 2.1 x 2.3 cm = volume: 9.8 mL. Contains a corpus
luteum cyst measuring 2 x 1.6 cm.

Left ovary

Measurements: 2.7 x 1.3 x 1.4 cm = volume: 2.7 mL. Normal
appearance/no adnexal mass.

Pulsed Doppler evaluation of both ovaries demonstrates normal
low-resistance arterial and venous waveforms.

Other findings

Trace fluid in the pelvis is most likely physiologic.
IMPRESSION: 1. A small amount of fluid is seen in the endometrial canal, likely
blood products given history of bleeding.
2. Corpus luteum cyst in the right ovary measuring 2 cm.
3. No other abnormalities identified.

ADDENDUM:
An IUD is appropriately placed within the endometrial canal towards
the fundus.

*** End of Addendum ***
FINDINGS: Uterus

Measurements: 6.6 x 4.0 x 4.8 cm = volume: 66.9 mL. No fibroids or
other mass visualized.

Endometrium

Thickness: 5 mm. There is a small amount of fluid in the endometrial
canal.

Right ovary

Measurements: 3.9 x 2.1 x 2.3 cm = volume: 9.8 mL. Contains a corpus
luteum cyst measuring 2 x 1.6 cm.

Left ovary

Measurements: 2.7 x 1.3 x 1.4 cm = volume: 2.7 mL. Normal
appearance/no adnexal mass.

Pulsed Doppler evaluation of both ovaries demonstrates normal
low-resistance arterial and venous waveforms.

Other findings

Trace fluid in the pelvis is most likely physiologic.
IMPRESSION: 1. A small amount of fluid is seen in the endometrial canal, likely
blood products given history of bleeding.
2. Corpus luteum cyst in the right ovary measuring 2 cm.
3. No other abnormalities identified.

## 2019-11-02 IMAGING — US US TRANSVAGINAL NON-OB
1 series · 13 of 25 positions shown · non-contrast
Comparison: None.
COMPARISON: None.

Addendum:
CLINICAL DATA: Vaginal bleeding.

EXAM:
TRANSABDOMINAL AND TRANSVAGINAL ULTRASOUND OF PELVIS
DOPPLER ULTRASOUND OF OVARIES
TECHNIQUE: Both transabdominal and transvaginal ultrasound examinations of the
pelvis were performed. Transabdominal technique was performed for
global imaging of the pelvis including uterus, ovaries, adnexal
regions, and pelvic cul-de-sac.
It was necessary to proceed with endovaginal exam following the
transabdominal exam to visualize the endometrium and ovaries. Color
and duplex Doppler ultrasound was utilized to evaluate blood flow to
the ovaries.

[Series 1: us transvaginal non-ob · 65 acquisitions, 13 frames shown]
[im 1/65]
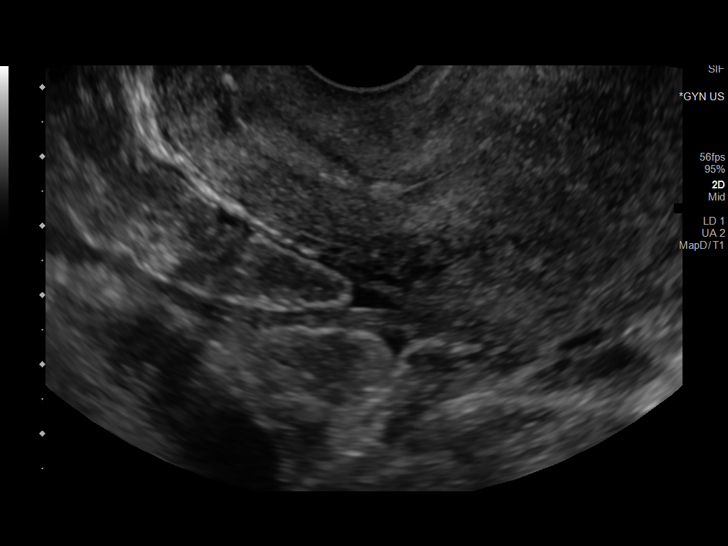
[im 6/65]
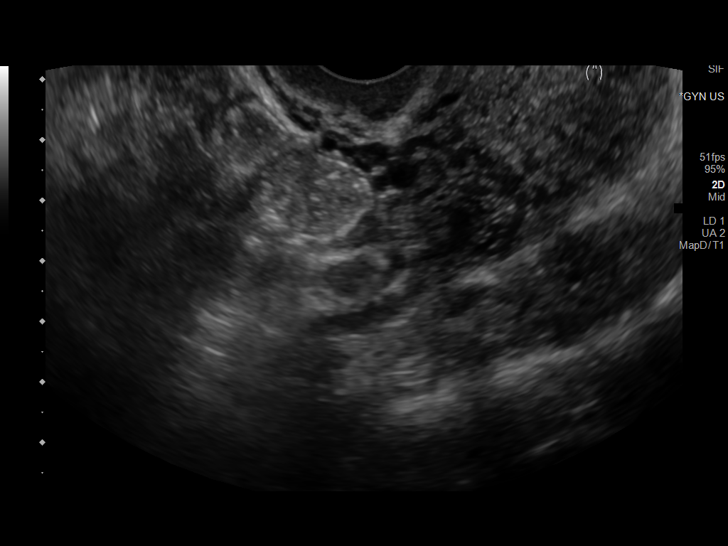
[im 11/65]
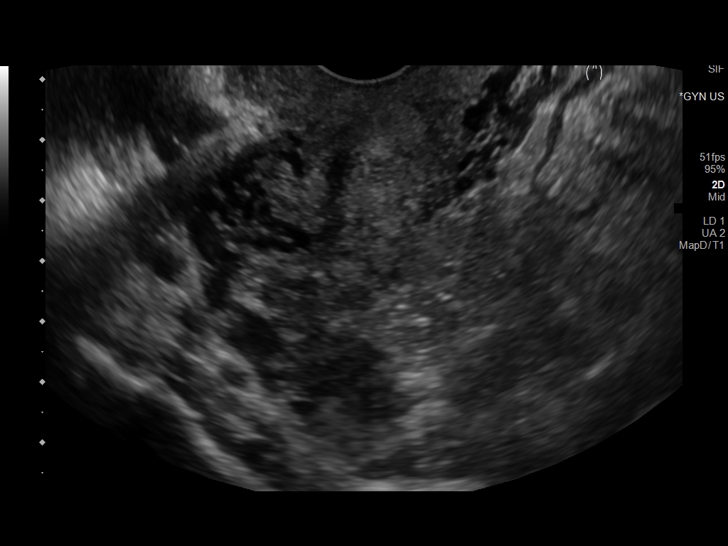
[im 17/65]
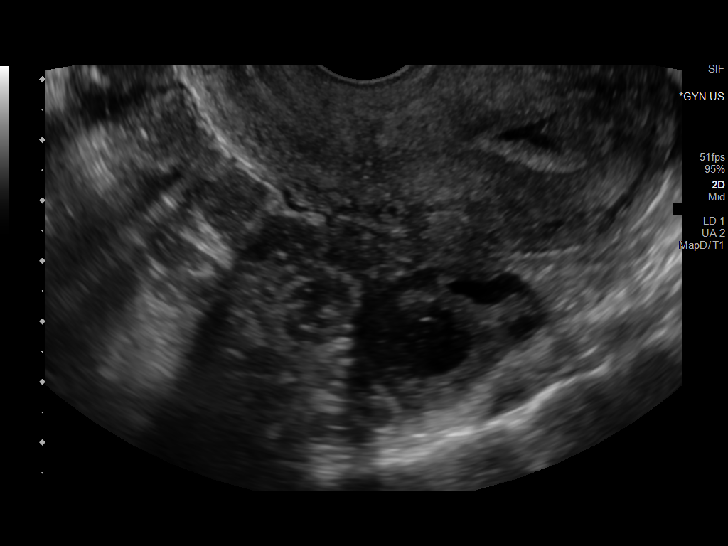
[im 22/65]
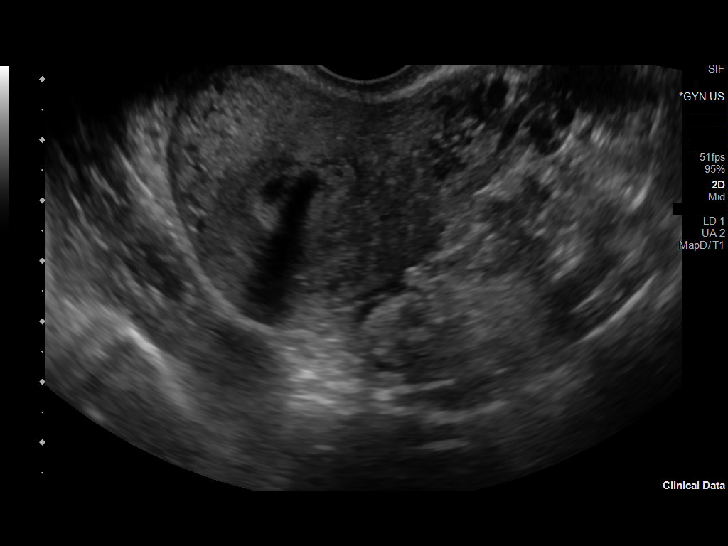
[im 27/65]
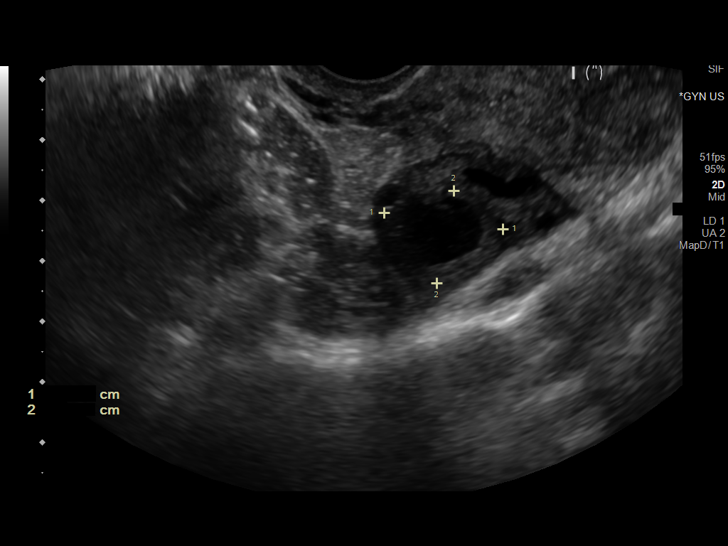
[im 33/65]
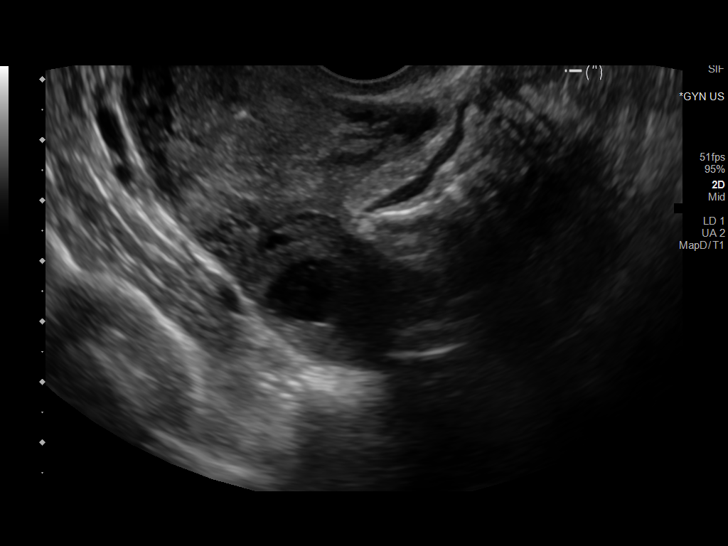
[im 38/65]
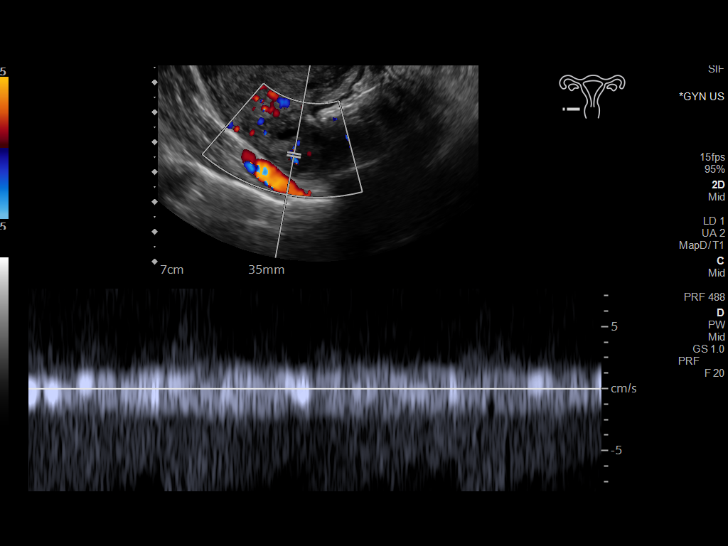
[im 43/65]
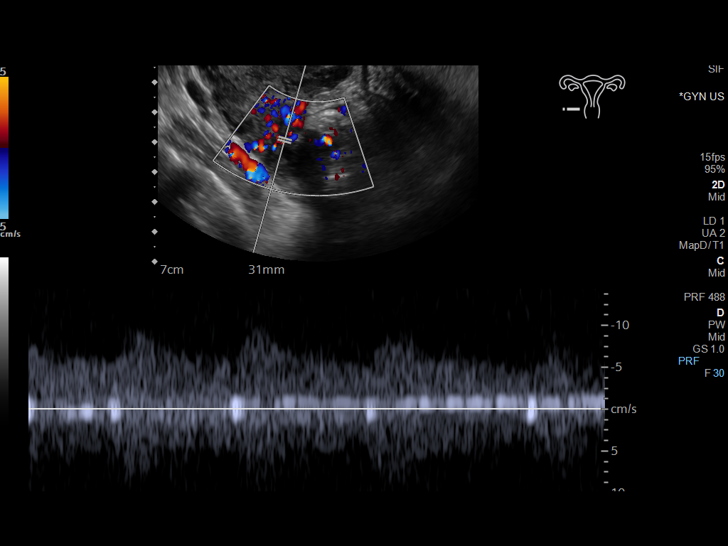
[im 49/65]
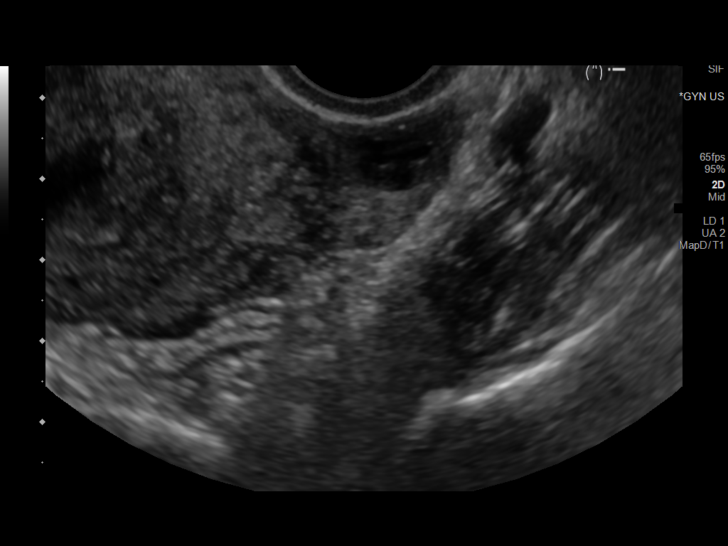
[im 54/65]
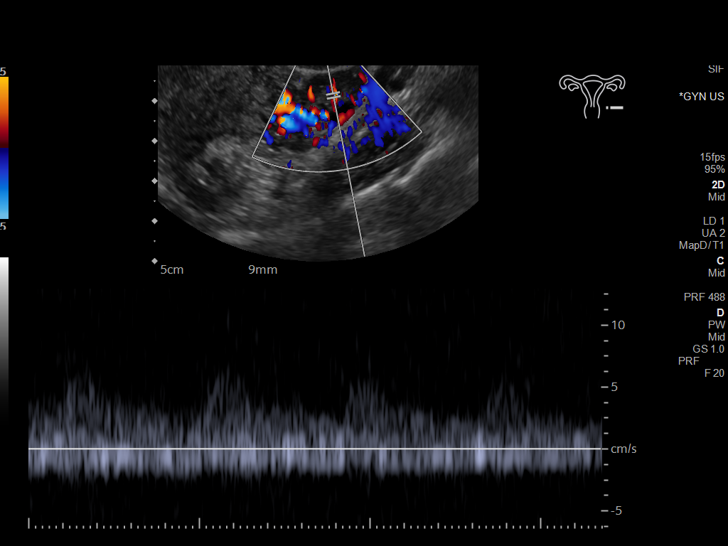
[im 59/65]
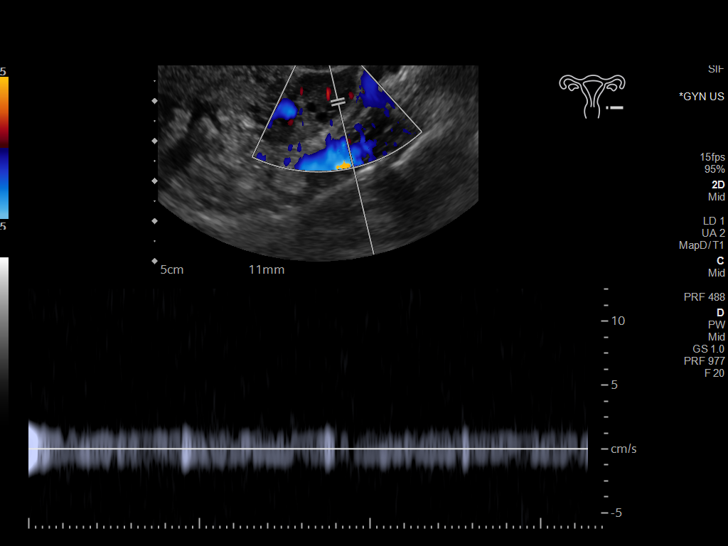
[im 65/65]
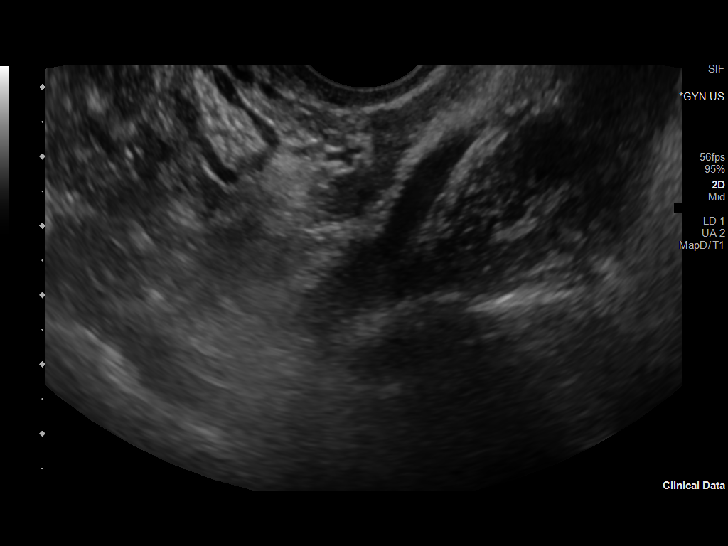

[13 of 25 positions shown; findings below may reference images not displayed]

FINDINGS: Uterus

Measurements: 6.6 x 4.0 x 4.8 cm = volume: 66.9 mL. No fibroids or
other mass visualized.

Endometrium

Thickness: 5 mm. There is a small amount of fluid in the endometrial
canal.

Right ovary

Measurements: 3.9 x 2.1 x 2.3 cm = volume: 9.8 mL. Contains a corpus
luteum cyst measuring 2 x 1.6 cm.

Left ovary

Measurements: 2.7 x 1.3 x 1.4 cm = volume: 2.7 mL. Normal
appearance/no adnexal mass.

Pulsed Doppler evaluation of both ovaries demonstrates normal
low-resistance arterial and venous waveforms.

Other findings

Trace fluid in the pelvis is most likely physiologic.
IMPRESSION: 1. A small amount of fluid is seen in the endometrial canal, likely
blood products given history of bleeding.
2. Corpus luteum cyst in the right ovary measuring 2 cm.
3. No other abnormalities identified.

ADDENDUM:
An IUD is appropriately placed within the endometrial canal towards
the fundus.

*** End of Addendum ***
FINDINGS: Uterus

Measurements: 6.6 x 4.0 x 4.8 cm = volume: 66.9 mL. No fibroids or
other mass visualized.

Endometrium

Thickness: 5 mm. There is a small amount of fluid in the endometrial
canal.

Right ovary

Measurements: 3.9 x 2.1 x 2.3 cm = volume: 9.8 mL. Contains a corpus
luteum cyst measuring 2 x 1.6 cm.

Left ovary

Measurements: 2.7 x 1.3 x 1.4 cm = volume: 2.7 mL. Normal
appearance/no adnexal mass.

Pulsed Doppler evaluation of both ovaries demonstrates normal
low-resistance arterial and venous waveforms.

Other findings

Trace fluid in the pelvis is most likely physiologic.
IMPRESSION: 1. A small amount of fluid is seen in the endometrial canal, likely
blood products given history of bleeding.
2. Corpus luteum cyst in the right ovary measuring 2 cm.
3. No other abnormalities identified.

## 2022-04-11 ENCOUNTER — Emergency Department (HOSPITAL_COMMUNITY)
Admission: EM | Admit: 2022-04-11 | Discharge: 2022-04-11 | Disposition: A | Discharge status: Home / Self Care | Payer: BC Managed Care – PPO | Attending: Emergency Medicine | Admitting: Emergency Medicine

## 2022-04-11 ENCOUNTER — Encounter (HOSPITAL_COMMUNITY): Payer: Self-pay

## 2022-04-11 ENCOUNTER — Other Ambulatory Visit: Payer: Self-pay

## 2022-04-11 DIAGNOSIS — H1033 Unspecified acute conjunctivitis, bilateral: Secondary | ICD-10-CM | POA: Diagnosis not present

## 2022-04-11 DIAGNOSIS — R42 Dizziness and giddiness: Secondary | ICD-10-CM | POA: Insufficient documentation

## 2022-04-11 DIAGNOSIS — Z20822 Contact with and (suspected) exposure to covid-19: Secondary | ICD-10-CM | POA: Diagnosis not present

## 2022-04-11 DIAGNOSIS — H579 Unspecified disorder of eye and adnexa: Secondary | ICD-10-CM | POA: Diagnosis present

## 2022-04-11 LAB — RESP PANEL BY RT-PCR (FLU A&B, COVID) ARPGX2
Influenza A by PCR: NEGATIVE
Influenza B by PCR: NEGATIVE
SARS Coronavirus 2 by RT PCR: NEGATIVE

## 2022-04-11 MED ORDER — NAPHAZOLINE-PHENIRAMINE 0.025-0.3 % OP SOLN: 1.0000 [drp] | OPHTHALMIC | 0 refills | Status: AC | PRN

## 2022-04-11 MED ORDER — AMOXICILLIN-POT CLAVULANATE 875-125 MG PO TABS: 1.0000 | ORAL_TABLET | Freq: Two times a day (BID) | ORAL | 0 refills | Status: AC

## 2022-04-11 MED ORDER — CIPROFLOXACIN HCL 0.3 % OP SOLN
2.0000 [drp] | OPHTHALMIC | Status: DC
  Administered 2022-04-11: 2 [drp] via OPHTHALMIC
  Filled 2022-04-11: qty 2.5

## 2022-04-11 MED ORDER — AMOXICILLIN-POT CLAVULANATE 875-125 MG PO TABS
1.0000 | ORAL_TABLET | Freq: Once | ORAL | Status: AC
  Administered 2022-04-11: 1 via ORAL
  Filled 2022-04-11: qty 1

## 2022-04-11 NOTE — ED Triage Notes (Signed)
Pt c/o bilateral eye drainage/possibly pink x2 weeks, dizziness x 2 days, and cold-like symptoms starting today.  Pt reports taking eye drops x 4 times per day x2 weeks.   ?

## 2022-04-11 NOTE — ED Provider Notes (Signed)
[facility]-EMERGENCY DEPT ?Provider Note ? ? ?CSN: [id number] ?Arrival date & time: 04/11/22  1824 ? ?  ? ?History ? ?Chief Complaint  ?Patient presents with  ? Dizziness  ? Eye Drainage  ? ? ?[name] is a 26 y.o. female here presenting with dizziness and eye redness.  Patient had conjunctivitis several months ago.  She states that her kids also has conjunctivitis recently.  She noticed worsening bilateral eye redness for the last several days.  She has been using her leftover Polytrim 4 times a day with minimal relief.  She states that she has worsening blurry vision bilaterally.  She also has some eyelid swelling as well.  Patient has some sinus congestion and dizziness as well.  Denies any focal weakness or trouble speaking.  ? ?The history is provided by the patient.  ? ?  ? ?Home Medications ?Prior to Admission medications   ?Medication Sig Start Date End Date Taking? Authorizing Provider  ?acetaminophen (TYLENOL) 325 MG tablet Take 325-650 mg by mouth every 6 (six) hours as needed for mild pain or headache.    [provider]  ?cyclobenzaprine (FLEXERIL) 10 MG tablet Take 1 tablet (10 mg total) by mouth 3 (three) times daily as needed for muscle spasms. 10/27/16   Katrinka Blazing, [place], CNM  ?ferrous sulfate 325 (65 FE) MG EC tablet Take 325 mg by mouth 1 day or 1 dose.    [provider]  ?ibuprofen (ADVIL,MOTRIN) 600 MG tablet Take 1 tablet (600 mg total) by mouth every 6 (six) hours. 01/11/17   Conard Novak, MD  ?metroNIDAZOLE (FLAGYL) 500 MG tablet Take 1 tablet (500 mg total) by mouth 2 (two) times daily. 09/20/19   Couture, Cortni S, PA-C  ?   ? ?Allergies    ?Other, Phenergan [promethazine hcl], Prednisone, and Vicodin [hydrocodone-acetaminophen]   ? ?Review of Systems   ?Review of Systems  ?Eyes:  Positive for discharge.  ?Neurological:  Positive for dizziness.  ?All other systems reviewed and are negative. ? ?Physical Exam ?Updated Vital Signs ?BP (!)  142/109   Pulse 78   Temp 97.8 ?F (36.6 ?C) (Oral)   Resp 18   SpO2 99%  ?Physical Exam ?Vitals and nursing note reviewed.  ?HENT:  ?   Head: Normocephalic.  ?   Nose: Nose normal.  ?   Mouth/Throat:  ?   Mouth: Mucous membranes are moist.  ?Eyes:  ?   Comments: Bilateral conjunctivitis.  No obvious purulent discharge.  Extraocular movements are intact.  Eyelids are swollen   ?Cardiovascular:  ?   Rate and Rhythm: Normal rate and regular rhythm.  ?   Pulses: Normal pulses.  ?   Heart sounds: Normal heart sounds.  ?Pulmonary:  ?   Effort: Pulmonary effort is normal.  ?   Breath sounds: Normal breath sounds.  ?Abdominal:  ?   General: Abdomen is flat.  ?   Palpations: Abdomen is soft.  ?Musculoskeletal:     ?   General: Normal range of motion.  ?   Cervical back: Normal range of motion and neck supple.  ?Skin: ?   General: Skin is warm.  ?   Capillary Refill: Capillary refill takes less than 2 seconds.  ?Neurological:  ?   General: No focal deficit present.  ?   Mental Status: She is alert and oriented to person, place, and time.  ?Psychiatric:     ?   Mood and Affect: Mood normal.     ?  Behavior: Behavior normal.  ? ? ?ED Results / Procedures / Treatments   ?Labs ?(all labs ordered are listed, but only abnormal results are displayed) ?Labs Reviewed  ?RESP PANEL BY RT-PCR (FLU A&B, COVID) ARPGX2  ? ? ?EKG ?None ? ?Radiology ?No results found. ? ?Procedures ?Procedures  ? ? ?Medications Ordered in ED ?Medications  ?ciprofloxacin (CILOXAN) 0.3 % ophthalmic solution 2 drop (2 drops Both Eyes Given 04/11/22 2058)  ?amoxicillin-clavulanate (AUGMENTIN) 875-125 MG per tablet 1 tablet (1 tablet Oral Given 04/11/22 2058)  ? ? ?ED Course/ Medical Decision Making/ A&P ?  ?                        ?Medical Decision Making ?[name] is a 26 y.o. female here presenting with bilateral conjunctivitis and some eyelid swelling and subjective dizziness.  She has been using Polytrim already.  Consider worsening  conjunctivitis versus periorbital cellulitis versus allergic conjunctivitis versus COVID.  We will send COVID test.  We will also give Cipro drops and Augmentin.  Patient has no signs of orbital cellulitis.  She has an ophthalmologist already.  I encouraged her to follow-up with her ophthalmologist ? ? ?Problems Addressed: ?Acute conjunctivitis of both eyes, unspecified acute conjunctivitis type: acute illness or injury ? ?Risk ?Prescription drug management. ? ?Final Clinical Impression(s) / ED Diagnoses ?Final diagnoses:  ?None  ? ? ?Rx / DC Orders ?ED Discharge Orders   ? ? None  ? ?  ? ? ?  ?Charlynne Pander, MD ?04/11/22 2122 ? ?

## 2022-04-11 NOTE — Discharge Instructions (Addendum)
Take augmentin twice daily.  ? ?Use cipro drops 2 drops both eyes every 4 hours during the day.  ? ?You should use visine drops every 4 hrs as well.  ? ?You are tested for COVID and if you are positive, you need to isolate per guidelines  ? ?See your eye doctor  ? ?Return to ER if you have worse eye pain or swelling or fevers  ?
